# Patient Record
Sex: Male | Born: 1945 | Race: White | Hispanic: No | Smoking: Former smoker
Health system: Southern US, Community
[De-identification: ages and names within clinical notes are randomized; demographics above are authoritative.]

## PROBLEM LIST (undated history)

## (undated) DIAGNOSIS — R7989 Other specified abnormal findings of blood chemistry: Secondary | ICD-10-CM

## (undated) DIAGNOSIS — J189 Pneumonia, unspecified organism: Secondary | ICD-10-CM

## (undated) DIAGNOSIS — C4490 Unspecified malignant neoplasm of skin, unspecified: Secondary | ICD-10-CM

## (undated) DIAGNOSIS — L719 Rosacea, unspecified: Secondary | ICD-10-CM

## (undated) DIAGNOSIS — I2699 Other pulmonary embolism without acute cor pulmonale: Secondary | ICD-10-CM

## (undated) DIAGNOSIS — E785 Hyperlipidemia, unspecified: Secondary | ICD-10-CM

## (undated) DIAGNOSIS — R7303 Prediabetes: Secondary | ICD-10-CM

## (undated) HISTORY — DX: Rosacea, unspecified: L71.9

## (undated) HISTORY — DX: Hyperlipidemia, unspecified: E78.5

## (undated) HISTORY — DX: Other specified abnormal findings of blood chemistry: R79.89

## (undated) HISTORY — PX: TONSILLECTOMY: SUR1361

## (undated) HISTORY — PX: CHOLECYSTECTOMY OPEN: SUR202

---
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
## 2003-07-30 HISTORY — PX: ORIF TIBIA & FIBULA FRACTURES: SHX2131

## 2004-01-27 ENCOUNTER — Ambulatory Visit (HOSPITAL_COMMUNITY): Admission: RE | Admit: 2004-01-27 | Discharge: 2004-01-27 | Payer: Self-pay | Admitting: Gastroenterology

## 2004-01-27 ENCOUNTER — Encounter (INDEPENDENT_AMBULATORY_CARE_PROVIDER_SITE_OTHER): Payer: Self-pay | Admitting: *Deleted

## 2004-03-18 ENCOUNTER — Inpatient Hospital Stay (HOSPITAL_COMMUNITY): Admission: EM | Admit: 2004-03-18 | Discharge: 2004-03-19 | Payer: Self-pay | Admitting: *Deleted

## 2008-09-04 ENCOUNTER — Emergency Department (HOSPITAL_COMMUNITY): Admission: EM | Admit: 2008-09-04 | Discharge: 2008-09-04 | Payer: Self-pay | Admitting: Emergency Medicine

## 2009-01-24 ENCOUNTER — Inpatient Hospital Stay (HOSPITAL_COMMUNITY): Admission: AD | Admit: 2009-01-24 | Discharge: 2009-01-26 | Payer: Self-pay | Admitting: General Surgery

## 2009-01-24 ENCOUNTER — Encounter (INDEPENDENT_AMBULATORY_CARE_PROVIDER_SITE_OTHER): Payer: Self-pay | Admitting: General Surgery

## 2010-01-24 ENCOUNTER — Encounter: Admission: RE | Admit: 2010-01-24 | Discharge: 2010-01-24 | Payer: Self-pay | Admitting: Family Medicine

## 2010-01-24 ENCOUNTER — Ambulatory Visit (HOSPITAL_COMMUNITY): Admission: RE | Admit: 2010-01-24 | Discharge: 2010-01-24 | Payer: Self-pay | Admitting: Family Medicine

## 2010-01-26 ENCOUNTER — Encounter (HOSPITAL_COMMUNITY): Admission: RE | Admit: 2010-01-26 | Discharge: 2010-01-29 | Payer: Self-pay | Admitting: Family Medicine

## 2010-10-14 LAB — PROTIME-INR
INR: 1.32 (ref 0.00–1.49)
Prothrombin Time: 16.3 seconds — ABNORMAL HIGH (ref 11.6–15.2)

## 2010-11-04 LAB — BASIC METABOLIC PANEL
BUN: 9 mg/dL (ref 6–23)
Calcium: 8.5 mg/dL (ref 8.4–10.5)
Chloride: 102 mEq/L (ref 96–112)
GFR calc Af Amer: >60 mL/min (ref >60)
GFR calc non Af Amer: >60 mL/min (ref >60)
Glucose, Bld: 138 mg/dL — ABNORMAL HIGH (ref 70–99)
Potassium: 3.9 mEq/L (ref 3.5–5.1)

## 2010-11-04 LAB — CBC
HCT: 40.7 % (ref 39.0–52.0)
MCHC: 34.6 g/dL (ref 30.0–36.0)

## 2010-11-05 LAB — COMPREHENSIVE METABOLIC PANEL
ALT: 27 U/L (ref 0–53)
Albumin: 4 g/dL (ref 3.5–5.2)
CO2: 25 mEq/L (ref 19–32)
Calcium: 9.2 mg/dL (ref 8.4–10.5)
Creatinine, Ser: 0.92 mg/dL (ref 0.4–1.5)
GFR calc non Af Amer: >60 mL/min (ref >60)
Total Bilirubin: 1.2 mg/dL (ref 0.3–1.2)
Total Protein: 7.5 g/dL (ref 6.0–8.3)

## 2010-11-05 LAB — BASIC METABOLIC PANEL
BUN: 9 mg/dL (ref 6–23)
CO2: 25 mEq/L (ref 19–32)
Chloride: 105 mEq/L (ref 96–112)
Creatinine, Ser: 0.85 mg/dL (ref 0.4–1.5)
GFR calc Af Amer: >60 mL/min (ref >60)
Glucose, Bld: 164 mg/dL — ABNORMAL HIGH (ref 70–99)

## 2010-11-05 LAB — CBC
Hemoglobin: 13.9 g/dL (ref 13.0–17.0)
Hemoglobin: 15 g/dL (ref 13.0–17.0)
MCV: 92.3 fL (ref 78.0–100.0)
Platelets: 180 10*3/uL (ref 150–400)
Platelets: 189 10*3/uL (ref 150–400)
RDW: 12.6 % (ref 11.5–15.5)
RDW: 13 % (ref 11.5–15.5)
WBC: 13.2 10*3/uL — ABNORMAL HIGH (ref 4.0–10.5)

## 2010-11-05 LAB — AMYLASE: Amylase: 32 U/L (ref 27–131)

## 2010-11-05 LAB — LIPASE, BLOOD: Lipase: 19 U/L (ref 11–59)

## 2010-11-13 LAB — COMPREHENSIVE METABOLIC PANEL
ALT: 35 U/L (ref 0–53)
BUN: 12 mg/dL (ref 6–23)
CO2: 23 mEq/L (ref 19–32)
Calcium: 9.1 mg/dL (ref 8.4–10.5)
Chloride: 104 mEq/L (ref 96–112)
GFR calc Af Amer: >60 mL/min (ref >60)
GFR calc non Af Amer: >60 mL/min (ref >60)
Potassium: 3.9 mEq/L (ref 3.5–5.1)
Total Bilirubin: 0.9 mg/dL (ref 0.3–1.2)
Total Protein: 6.7 g/dL (ref 6.0–8.3)

## 2010-11-13 LAB — DIFFERENTIAL
Basophils Absolute: 0.4 10*3/uL — ABNORMAL HIGH (ref 0.0–0.1)
Basophils Relative: 4 % — ABNORMAL HIGH (ref 0–1)
Eosinophils Absolute: 0 10*3/uL (ref 0.0–0.7)
Monocytes Relative: 5 % (ref 3–12)
Neutro Abs: 8.6 10*3/uL — ABNORMAL HIGH (ref 1.7–7.7)

## 2010-11-13 LAB — CBC: HCT: 42.3 % (ref 39.0–52.0)

## 2010-12-11 NOTE — H&P (Signed)
[name], BURAK              ACCOUNT NO.:  [id number]   MEDICAL RECORD NO.:  [id number]         PATIENT TYPE:  LINP   LOCATION:                               FACILITY:  [facility]   PHYSICIAN:  Almond Lint, MD       DATE OF BIRTH:  Dec 02, 1945   DATE OF ADMISSION:  01/24/2009  DATE OF DISCHARGE:                              HISTORY & PHYSICAL   CHIEF COMPLAINT:  Abdominal pain.   HISTORY OF PRESENT ILLNESS:  Mr. Alpert is a 65 year old male who was  diagnosed with gall stones at the beginning of this year.  He saw Dr.  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  He comes in today to the urgent  office with 2 days of unrelenting abdominal pain.  He tried taking  oxycodone, which helped some, but in the last day-and-a-half he has been  unable to take pills because of the extreme nausea and pain.  He has not  eaten any food for 2 days either.  He is in tears on the examining room  table because of the pain.  He describes this as a dull ache in the  epigastrium and right upper quadrant.  He denies fevers or chills.   PAST MEDICAL HISTORY:  Significant for hypercholesterolemia and rosacea.   PAST SURGICAL HISTORY:  Appendectomy.   FAMILY HISTORY:  Father had an MI.   REVIEW OF SYSTEMS:  Otherwise, negative x11 systems.   PHYSICAL EXAM:  VITAL SIGNS:  Temperature 98.4, pulse 52, blood pressure  176/82, height 6 feet 2 inches, weight 215 pounds.  GENERAL:  He is alert and oriented x3 and looks uncomfortable.  He is  lying on the table and crying in pain.  PSYCH:  Mood and affect are normal.  CARDIOVASCULAR:  Regular.  ABDOMEN:  Soft, nondistended.  Tender in the epigastric area.  EXTREMITIES:  Warm.   DIAGNOSTIC STUDIES:  Ultrasound from [facility] noted by Dr.  Lindie Spruce to be positive for stones and mild gallbladder wall thickening.  This was performed on September 27, 2008.  He had fatty infiltration of the  liver.   ASSESSMENT AND PLAN:  We will admit him to the hospital for IV pain  medication, IV antibiotics, and labs.  He will have a cholecystectomy as  appropriate.  I will leave him n.p.o. for possible surgical intervention  tonight or tomorrow.  This was discussed with Dr. Derrell Lolling to admit.     Almond Lint, MD  Electronically Signed    FB/MEDQ  D:  01/24/2009  T:  01/24/2009  Job:  [id number]

## 2010-12-11 NOTE — Op Note (Signed)
[name], [name]              ACCOUNT NO.:  [id number]   MEDICAL RECORD NO.:  [id number]          PATIENT TYPE:  INP   LOCATION:  1606                         FACILITY:  [facility]   PHYSICIAN:  Sharlet Salina T. Hoxworth, M.D.DATE OF BIRTH:  08-25-1945   DATE OF PROCEDURE:  01/25/2009  DATE OF DISCHARGE:                               OPERATIVE REPORT   PREOPERATIVE DIAGNOSES:  1. Cholelithiasis and cholecystitis.  2. Umbilical hernia.   POSTOPERATIVE DIAGNOSES:  1. Cholelithiasis and cholecystitis.  2. Umbilical hernia.   SURGICAL PROCEDURES:  1. Laparoscopic cholecystectomy with intraoperative cholangiogram.  2. Repair of umbilical hernia.   SURGEON:  Lorne Skeens. Hoxworth, M.D.   ASSISTANT:  Dr. Cicero Duck   ANESTHESIA:  General.   BRIEF HISTORY:  [name] is a 64 year old male with a previous history  of episodic abdominal pain and known gallstones.  He had been  tentatively scheduled for cholecystectomy but presents with several days  of persistent epigastric abdominal pain and was found to be tender in  the office and was admitted yesterday.  Repeated ultrasound shows  thickening of the gallbladder wall, gallstones and normal common bile  duct.  LFTs are normal.  We have recommended proceeding with urgent  laparoscopic cholecystectomy with cholangiogram.  He also has a  symptomatic umbilical hernia that will be repaired simultaneously.  The  nature of the procedures, indications, risks of bleeding, infection,  bile leak, bile duct injury, anesthetic complications were discussed and  understood.  He is now brought to the operating room for these  procedures.   DESCRIPTION OF OPERATION:  The patient was brought to the operating  room, placed in supine position on the operating table and general  endotracheal anesthesia was induced.  He received preoperative IV  antibiotics.  The abdomen was widely sterilely prepped and draped.  PAS  were placed.  Correct patient and  procedure were verified.   I made an infraumbilical curvilinear incision just beneath the hernia.  Dissection was carried down to subcutaneous tissue.  The umbilical skin  was dissected up off of the hernia sac and the hernia contents were  dissected way from surrounding subcu down to the level the fascia.  The  hernia sac was opened and some preperitoneal fat reduced and using this  opening with two stay sutures of 0 Vicryl, the Hasson trocar was placed  and pneumoperitoneum established.  Under direct vision, an 11-mm trocar  was placed in the subxiphoid area and two 5-mm trocars along the right  subcostal margin.  The colon was somewhat dilated, but not terribly in  the way.  The gallbladder was exposed and some omental inflammatory  adhesions were taken down.  The gallbladder was tense, thickened and  acutely and subacutely inflamed.  The gallbladder was aspirated with  aspiration needle with fairly good decompression and the fundus was  grasped and elevated up over the liver.  Further fibrofatty adhesions  were taken down off the infundibulum, which was then retracted  inferolaterally.  The distal gallbladder was dissected and there was a  lot of induration and edema, but the dissection proceeded  satisfactorily  and we were able to fully dissect the distal gallbladder out of Calot's  triangle and the cystic duct was identified and the cystic artery  clearly identified in Calot's triangle and the cystic duct gallbladder  junction dissected 360 degrees and the cystic duct dissected out over  about a centimeter.   The cystic duct also appeared somewhat inflamed and not completely  healthy.  The cystic duct was clipped at the gallbladder junction and  then the operative cholangiogram obtained via the cystic duct.  There  was some extravasation of contrast at the cystic duct but also good  filling of a normal common bile duct and intrahepatic ducts and there  was free flow into the  duodenum without filling defects or obstruction.  Following this, the cholangiocath was removed.  The cystic duct was  triply clipped proximally and divided.  The cystic artery was clearly  identified in Calot's triangle, was doubly clipped proximally, clipped  distally and divided.  The gallbladder was then dissected free from its  bed using hook cautery.  Again noted were some marked inflammatory  changes and patchy gangrenous changes.  The gallbladder was detached,  placed into an EndoCatch bag.   Hemostasis was assured in the gallbladder bed.  The right upper quadrant  was irrigated until clear.  A closed suction drain was left in the  subhepatic space and brought out through one of the lateral 5-mm trocar  sites.  Gallbladder was then withdrawn through the umbilical defect.   The fascia at the umbilical defect was then clearly defined, dissected  free slightly and herniated preperitoneal fat excised with cautery.  The  hernia was then closed transversely with interrupted 0 Prolene sutures.  We could visualize the repair intra-abdominally with  the laparoscopic.  There was no tissue intra-abdominally caught up into the repair.   Following this, camera was removed, CO2 evacuated and trocars removed.  Skin incisions were closed with subcuticular Monocryl and Steri-Strips.  Sponge and instrument counts were correct.  The patient was taken to  recovery in good condition.      Lorne Skeens. Hoxworth, M.D.  Electronically Signed     BTH/MEDQ  D:  01/25/2009  T:  01/25/2009  Job:  [id number]

## 2010-12-14 NOTE — Op Note (Signed)
NAME:  [name], [name]                        ACCOUNT NO.:  [id number]   MEDICAL RECORD NO.:  [id number]                   PATIENT TYPE:  AMB   LOCATION:  ENDO                                 FACILITY:  [facility]   PHYSICIAN:  Petra Kuba, M.D.                 DATE OF BIRTH:  01-Feb-1946   DATE OF PROCEDURE:  01/27/2004  DATE OF DISCHARGE:                                 OPERATIVE REPORT   PROCEDURE:  Colonoscopy.   INDICATION:  Screening.  Consent was signed after risks, benefits, methods,  options thoroughly discussed in the office.   MEDICINES USED:  1. Demerol 70.  2. Versed 7.   DESCRIPTION OF PROCEDURE:  Rectal inspection was pertinent for external  hemorrhoids, small.  Digital exam was negative.  The video pediatric  adjustable colonoscope was inserted and with rolling him on his back and  abdominal pressure, fairly easily advanced around the colon to the cecum.  No obvious abnormality was seen on insertion.  The cecum was identified by  the appendiceal orifice and the ileocecal valve.  The prep was adequate.  There was some liquid stool that required washing and suctioning.  Scope was  slowly withdrawn.  In the cecal pole, 2 questionable tiny polyps were seen  and were carefully hot biopsied x 1 each.  Scope was further withdrawn.  There were 2 other tiny ascending and hepatic flexure tiny polyps which as  well were hot biopsied x 1 and put in the same container and possibly 1  other tiny proximal transverse which again was hot biopsied and put in the  same container.  The scope was further withdrawn.  The rest of the  transverse was normal.  As the scope was withdrawn around the splenic  flexure, another tiny polyp was seen and was hot biopsied x 1 and put in a  second container.  There was a small proximal sigmoid polyp then seen which  was hot biopsied x 3 and put in the same container with the tiny one from  the descending.  Scope was slowly withdrawn.  Two tiny distal  sigmoid polyps  were seen, hot biopsied, and put in a third container.  No other  abnormalities were seen as we slowly withdrew back to the rectum.  Anorectal  pull-through and retroflexion confirmed some small hemorrhoids.  The scope  was reinserted a short ways up the left side of the colon; air and water  were suctioned and scope removed.  The patient tolerated the procedure  adequately.  There was no obvious immediate complication.   ENDOSCOPIC DIAGNOSES:  1. Internal/external hemorrhoids.  2. Multiple tiny polyps, hot biopsied, sigmoid, descending, and right side.  3. One small polyp in the proximal sigmoid, hot biopsied x 3.  4. Otherwise, within normal limits to the cecum.   PLAN:  1. Await pathology to determine future colonic screening.  2. Otherwise, return care to  Dr. Julian Reil for the customary health care     maintenance to include yearly rectals and guaiacs.                                               Petra Kuba, M.D.    MEM/MEDQ  D:  01/27/2004  T:  01/27/2004  Job:  [id number]   cc:   Schuyler Amor, M.D.  [address]  [place], [place] [address]  Fax: [phone]

## 2010-12-14 NOTE — H&P (Signed)
NAME:  [name], GOSWAMI                        ACCOUNT NO.:  [id number]   MEDICAL RECORD NO.:  [id number]                   PATIENT TYPE:  INP   LOCATION:  0102                                 FACILITY:  [facility]   PHYSICIAN:  Feliberto Gottron. Turner Daniels, M.D.                DATE OF BIRTH:  05/12/1946   DATE OF ADMISSION:  03/18/2004  DATE OF DISCHARGE:                                HISTORY & PHYSICAL   CHIEF COMPLAINT:  Right tibiofibular fracture, distal tibial, proximal  fibula, spiral, low energy.   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 __________ at [facility] ER, told  him that he wanted to have the tibia fixed here in [place].  I was  contacted and transfer arranged.  He arrived in [place] around 2:00 in  the afternoon on 18 March 2004, and the date of injury was 17 March 2004.  No other injuries.  The fracture was closed, displaced about 80%, but  essentially no angular deformity.  No previous orthopedic problems.   PAST MEDICAL HISTORY:  1. Appendectomy at age 27.  No problems with anesthesia.  2. Had a colonoscopy 2 months ago that was negative.   FAMILY HISTORY:  He is married, lives with his three children, is a Production assistant, radio at [employer], and has asked to resume working this  week.   ALLERGIES:  No known allergies.   MEDICATIONS:  1. Lipitor 10 mg daily.  2. Minocycline 100 mg p.o. daily p.r.n.   REVIEW OF SYSTEMS:  No chest pain, no shortness of breath, no peptic ulcer  disease, no loss of consciousness or seizures.  No significant medical  problems.   PHYSICAL EXAMINATION:   VITAL SIGNS:  Temperature 99.3, pulse 63, respiratory  rate 18, blood pressure 132/68.  GENERAL:  Well-nourished, well-developed, 65 year old man about 200 pounds  and 6 feet tall.  HEENT:  He wears glasses.  His teeth are intact.  Nose and throat are clear.  TMs are clear.  Pupils are PERRLA, full extraocular range of motion.  NECK:  Supple, full range of motion of the neck.  CHEST:  Clear.  HEART:  Regular rate and rhythm.  ABDOMEN:  Soft and nontender.  EXTREMITIES:  Right lower extremity is in a splint.  That was not removed  since the orthopedist in [place] reassured me that this was a closed  injury, and the patient also stated that there was no blood or abrasions on  his leg.  He moves his toes up and down  without difficulty.  He has normal  dorsalis pedis pulse.  Plain radiographs are reviewed and are as dictated.  No other lab data is available.  NEUROLOGIC:  He is alert and oriented x 3.   ASSESSMENT:  Closed low energy spiral tibiofibular fracture, distal tibia,  proximal fibula in a 65 year old man, otherwise healthy.   PLAN:  He will be taken for percutaneous open reduction, internal fixation  using a DePuy titanium IM nail with proximal and distal interlock.  Risks  and benefits of surgery discussed with the patient.  __________ surgical  intervention, will be kept for 23 hour observation at [facility].                                               Feliberto Gottron. Turner Daniels, M.D.    Ovid Curd  D:  03/18/2004  T:  03/18/2004  Job:  [id number]   cc:   Delories Heinz. [place]

## 2010-12-14 NOTE — Op Note (Signed)
NAME:  [name], [name]                        ACCOUNT NO.:  [id number]   MEDICAL RECORD NO.:  [id number]                   PATIENT TYPE:  INP   LOCATION:  0102                                 FACILITY:  [facility]   PHYSICIAN:  Feliberto Gottron. Turner Daniels, M.D.                DATE OF BIRTH:  1945-09-05   DATE OF PROCEDURE:  03/18/2004  DATE OF DISCHARGE:                                 OPERATIVE REPORT   PREOPERATIVE DIAGNOSIS:  Right tib/fib fracture, distal spiral on the tibia,  proximal spiral on the fibula.   POSTOPERATIVE DIAGNOSIS:  Right tib/fib fracture, distal spiral on the  tibia, proximal spiral on the fibula.   PROCEDURE:  Open reduction and internal fixation using a 9 mm x 37.5 cm Ace  Depuy nail two proximal cross locking screws, three distal locking screws,  one anterior posterior, two medial to lateral.   SURGEON:  Feliberto Gottron. Turner Daniels, M.D.   ASSISTANT:  __________ P.A.C.   ANESTHETIC:  General endotracheal.   ESTIMATED BLOOD LOSS:  100 cc.   FLUIDS REPLACED:  One liter of crystalloid.   DRAINS PLACED:  None.   TOURNIQUET TIME:  None.   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  He sent his sons off to find help and he stayed in the woods by  himself for about 12 hours.  Help came and carried him out of the woods and  he was taken to [facility] and arrived there at around 6:00 on the  morning of 21 August, 2005.  He was evaluated by Dr. __________, an  orthopedist in [place] who arranged transfer to [facility] for  open reduction and internal fixation.  The risks and benefits of surgery  were well understood by the patient.   DESCRIPTION OF PROCEDURE:  The patient was identified by the armband and  taken to the operating room at [facility].   Appropriate  anesthetic monitor was attached.  General endotracheal anesthesia was  induced with the patient in the supine position.  A tourniquet was applied  high to the right thigh, but never used and the right lower extremity was  prepped and draped in the usual sterile fashion from the toes to the  tourniquet.  Using the large triangle, well padded, the knee was hyperflexed  and a medial parapatellar incision was made in the skin starting out at the  level of the tibial plateau and going proximal towards the inferior pole of  the patella, about 4 cm in length to the skin and subcutaneous tissue.  An  incision was made in the medial retinaculum along the patellar tendon,  allowing [place] to place an awl into the center of the proximal  tibia about 1 cm  behind the shoulder of the plateau.  This was followed by the small guide  wire, which was over reamed with the 13 mm reamer followed by the ball-  tipped guide wire that was slightly bent and this was then taken down  through the tibial shaft through the fracture site into the center of the  tibial metaphyseal flare to the level of the epiphyseal scar.  We then  sequentially reamed up to 10.5 mm flexible reamer using a tissue protector  proximally and measured for a 9 mm x 37.5 cm tibial nail.  This was  assembled on a driver and driven to the appropriate depth.  The tip of the  nail was near the epiphyseal scar about 1 mm or 2 mm above it and proximally  it was about 1 cm below the shoulder of the tibial plateau.  With the  driving platform in place, the crossed locking screws were placed  proximally.  One, I believe, was 45 mm in length and one was 65 mm in length  from the DePuy tibial nail set.  These were placed through small stab wounds  using the tibial guide.  The C arm control was used to confirm good position  of the screws.  Distally, using a free-hand technique, two medial to lateral  screws and one anterior-posterior screw was  used to fix the rod distally,  obtaining good firm fixation and the C arm image has confirmed basically an  anatomic reduction of the spiral fracture.  All wounds were thoroughly  washed out with normal saline solution.  The stab wounds were closed with  single 4-0 nylon suture loops.  The medial parapatellar ligament wound was  closed with running 2-0 Vicryl suture and then a running 4-0 nylon suture.  Dressing of Xeroform 4 x 4 dressing sponges and an ACE wrap was applied.  No  splint was used because of the firm fixation.  The patient was awakened and  taken to the recovery room without difficulty for observation x 23 hours or  discharge home today.                                               Feliberto Gottron. Turner Daniels, M.D.    Ovid Curd  D:  03/18/2004  T:  03/18/2004  Job:  [id number]

## 2011-03-29 ENCOUNTER — Ambulatory Visit
Admission: RE | Admit: 2011-03-29 | Discharge: 2011-03-29 | Disposition: A | Discharge status: Home / Self Care | Payer: BC Managed Care – PPO | Source: Ambulatory Visit | Attending: Family Medicine | Admitting: Family Medicine

## 2011-03-29 ENCOUNTER — Other Ambulatory Visit: Payer: Self-pay | Admitting: Family Medicine

## 2011-03-29 DIAGNOSIS — M79669 Pain in unspecified lower leg: Secondary | ICD-10-CM

## 2012-03-17 ENCOUNTER — Ambulatory Visit (INDEPENDENT_AMBULATORY_CARE_PROVIDER_SITE_OTHER): Payer: BC Managed Care – PPO | Admitting: Family Medicine

## 2012-03-17 VITALS — BP 110/60 | HR 54 | Temp 98.7°F | Resp 16 | Ht 73.0 in | Wt 198.0 lb

## 2012-03-17 DIAGNOSIS — B356 Tinea cruris: Secondary | ICD-10-CM

## 2012-03-17 MED ORDER — CLOTRIMAZOLE 1 % EX CREA: TOPICAL_CREAM | Freq: Two times a day (BID) | CUTANEOUS | Status: AC

## 2012-03-17 NOTE — Progress Notes (Signed)
  Subjective:    Patient ID: [name], male    DOB: Sep 17, 1945, 66 y.o.   MRN: [id number]  HPI [name] is a 66 y.o. male  rash in groin - past 5 days.  Itching. New sexual contact 5 days ago.  Worried that new partner may have given this to him.  Tx: none. 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 On coumadin 7.5mg  qd. Last INR 2.6 to 2.8.  Takes minocyline for rosacea, Prediabetes.  [facility] - primary provider.    Review of Systems  Genitourinary: Negative for frequency, discharge, genital sores, penile pain and testicular pain.       Objective:   Physical Exam  Constitutional: He is oriented to person, place, and time. He appears well-developed and well-nourished.  Pulmonary/Chest: Effort normal.  Neurological: He is alert and oriented to person, place, and time.  Skin: Skin is warm. Rash noted.          Elliptical slight erythematous rash. Inguinal area bilaterally.  Slight elevated borders.  No penile discharge, or scrotal erythema.   Psychiatric: He has a normal mood and affect. His behavior is normal.       Assessment & Plan:  [name] is a 66 y.o. male 1. Tinea cruris  clotrimazole (LOTRIMIN) 1 % cream  apply to affected areas bilaterally for approx 2 weeks. H/o given from utd.  Discussed std testing with new partner and unprotected sex.  Declined at this point. rtc recautions/.

## 2012-03-17 NOTE — Patient Instructions (Addendum)
Jock Itch Jock itch is a fungal infection of the skin in the groin area. It is sometimes called "ringworm" even though it is not caused by a worm. A fungus is a type of germ that thrives in dark, damp places.  CAUSES  This infection may spread from:  A fungus infection elsewhere on the body (such as athlete's foot).   Sharing towels or clothing.  This infection is more common in:  Hot, humid climates.   People who wear tight-fitting clothing or wet bathing suits for long periods of time.   Athletes.   Overweight people.   People with diabetes.  SYMPTOMS  Jock itch causes the following symptoms:  Red, pink or brown rash in the groin. Rash may spread to the thighs, anus, and buttocks.   Itching.  DIAGNOSIS  Your caregiver may make the diagnosis by looking at the rash. Sometimes a skin scraping will be sent to test for fungus. Testing can be done either by looking under the microscope or by doing a culture (test to try to grow the fungus). A culture can take up to 2 weeks to come back. TREATMENT  Jock itch may be treated with:  Skin cream or ointment to kill fungus.   Medicine by mouth to kill fungus.   Skin cream or ointment to calm the itching.   Compresses or medicated powders to dry the infected skin.  HOME CARE INSTRUCTIONS   Be sure to treat the rash completely. Follow your caregiver's instructions. It can take a couple of weeks to treat. If you do not treat the infection long enough, the rash can come back.   Wear loose-fitting clothing.   Men should wear cotton boxer shorts.   Women should wear cotton underwear.   Avoid hot baths.   Dry the groin area well after bathing.  SEEK MEDICAL CARE IF:   Your rash is worse.   Your rash is spreading.   Your rash returns after treatment is finished.   Your rash is not gone in 4 weeks. Fungal infections are slow to respond to treatment. Some redness may remain for several weeks after the fungus is gone.  SEEK  IMMEDIATE MEDICAL CARE IF:  The area becomes red, warm, tender, and swollen.   You have a fever.  Document Released: 07/05/2002 Document Revised: 07/04/2011 Document Reviewed: 06/03/2008 [facility] Patient Information 2012 [place], [place].  Return to the clinic or go to the nearest emergency room if any of your symptoms worsen or new symptoms occur.

## 2012-04-04 ENCOUNTER — Ambulatory Visit (INDEPENDENT_AMBULATORY_CARE_PROVIDER_SITE_OTHER): Payer: BC Managed Care – PPO | Admitting: Emergency Medicine

## 2012-04-04 VITALS — BP 129/63 | HR 66 | Temp 98.1°F | Resp 16 | Ht 73.0 in | Wt 195.0 lb

## 2012-04-04 DIAGNOSIS — N529 Male erectile dysfunction, unspecified: Secondary | ICD-10-CM

## 2012-04-04 DIAGNOSIS — E1169 Type 2 diabetes mellitus with other specified complication: Secondary | ICD-10-CM

## 2012-04-04 MED ORDER — VARDENAFIL HCL 20 MG PO TABS: 20.0000 mg | ORAL_TABLET | Freq: Every day | ORAL | Status: DC | PRN

## 2012-04-04 NOTE — Addendum Note (Signed)
Addended by: Carmelina Dane on: 04/04/2012 03:45 PM   Modules accepted: Orders

## 2012-04-04 NOTE — Progress Notes (Signed)
   Date:  04/04/2012   Name:  [name]   DOB:  May 29, 1946   MRN:  [id number] Gender: male Age: 66 y.o.  PCP:  No primary provider on file.    Chief Complaint: ED, testerone testing   History of Present Illness:  [name] is a 67 y.o. pleasant patient who presents with the following:  Has erectile dysfunction.  Not having good luck with cialis or viagra and has requested testosterone level and a prescription for levitra.    There is no problem list on file for this patient.   No past medical history on file.  No past surgical history on file.  History  Substance Use Topics  . Smoking status: Former Smoker -- 20 years    Types: Cigarettes    Quit date: 03/17/1992  . Smokeless tobacco: Not on file  . Alcohol Use: Not on file    No family history on file.  No Known Allergies  Medication list has been reviewed and updated.  Current Outpatient Prescriptions on File Prior to Visit  Medication Sig Dispense Refill  . metFORMIN (GLUCOPHAGE) 500 MG tablet Take 500 mg by mouth 2 (two) times daily with a meal.      . minocycline (MINOCIN,DYNACIN) 100 MG capsule Take 100 mg by mouth daily as needed.      . simvastatin (ZOCOR) 20 MG tablet Take 20 mg by mouth every evening.      . warfarin (COUMADIN) 7.5 MG tablet Take 7.5 mg by mouth daily.      . clotrimazole (LOTRIMIN) 1 % cream Apply topically 2 (two) times daily.  30 g  1    Review of Systems:  As per HPI, otherwise negative.    Physical Examination: Filed Vitals:   04/04/12 1523  BP: 129/63  Pulse: 66  Temp: 98.1 F (36.7 C)  Resp: 16   Filed Vitals:   04/04/12 1523  Height: 6\' 1"  (1.854 m)  Weight: 195 lb (88.451 kg)   Body mass index is 25.73 kg/(m^2). Ideal Body Weight: Weight in (lb) to have BMI = 25: 189.1    GEN: WDWN, NAD, Non-toxic, Alert & Oriented x 3 HEENT: Atraumatic, Normocephalic.  Ears and Nose: No external deformity. EXTR: No clubbing/cyanosis/edema NEURO: Normal gait.    PSYCH: Normally interactive. Conversant. Not depressed or anxious appearing.  Calm demeanor.    Assessment and Plan: Erectile dysfunction Testosterone Follow up as needed  Carmelina Dane, MD

## 2012-04-05 ENCOUNTER — Other Ambulatory Visit: Payer: Self-pay | Admitting: Emergency Medicine

## 2012-04-05 LAB — TESTOSTERONE: Testosterone: 228.42 ng/dL — ABNORMAL LOW (ref 300–890)

## 2012-04-05 MED ORDER — TESTOSTERONE 10 MG/ACT (2%) TD GEL: 40.0000 mg | Freq: Every day | TRANSDERMAL | Status: DC

## 2012-07-05 ENCOUNTER — Other Ambulatory Visit: Payer: Self-pay | Admitting: Radiology

## 2012-07-05 ENCOUNTER — Other Ambulatory Visit: Payer: Self-pay | Admitting: Emergency Medicine

## 2012-07-05 NOTE — Telephone Encounter (Signed)
That is fine 

## 2012-07-05 NOTE — Telephone Encounter (Signed)
[place] [facility] faxed request for [name] 10mg  gel please advise on refill.

## 2012-07-07 MED ORDER — TESTOSTERONE 10 MG/ACT (2%) TD GEL: 40.0000 mg | Freq: Every day | TRANSDERMAL | Status: DC

## 2012-07-07 NOTE — Telephone Encounter (Signed)
Called this in for him.

## 2012-07-13 ENCOUNTER — Other Ambulatory Visit: Payer: Self-pay | Admitting: Radiology

## 2012-11-16 ENCOUNTER — Ambulatory Visit (HOSPITAL_COMMUNITY)
Admission: RE | Admit: 2012-11-16 | Discharge: 2012-11-16 | Disposition: A | Discharge status: Home / Self Care | Payer: BC Managed Care – PPO | Source: Ambulatory Visit | Attending: Family Medicine | Admitting: Family Medicine

## 2012-11-16 DIAGNOSIS — R229 Localized swelling, mass and lump, unspecified: Secondary | ICD-10-CM | POA: Insufficient documentation

## 2012-11-16 DIAGNOSIS — Z86718 Personal history of other venous thrombosis and embolism: Secondary | ICD-10-CM

## 2012-11-16 DIAGNOSIS — M25562 Pain in left knee: Secondary | ICD-10-CM

## 2012-11-16 NOTE — Progress Notes (Signed)
*  PRELIMINARY RESULTS* Vascular Ultrasound Left lower extremity venous duplex has been completed.  Preliminary findings:  Left = Negative for DVT. Superficial thrombosis of a varicose vein is noted at proximal calf area.  Called report to Dr. Nelda Severe.  Farrel Demark, RDMS, RVT  11/16/2012, 6:33 PM

## 2012-11-18 ENCOUNTER — Other Ambulatory Visit: Payer: Self-pay | Admitting: Gastroenterology

## 2013-10-25 ENCOUNTER — Ambulatory Visit (INDEPENDENT_AMBULATORY_CARE_PROVIDER_SITE_OTHER): Payer: Medicare Other | Admitting: Emergency Medicine

## 2013-10-25 VITALS — BP 122/64 | HR 54 | Temp 97.5°F | Resp 16 | Ht 73.0 in | Wt 190.0 lb

## 2013-10-25 DIAGNOSIS — R21 Rash and other nonspecific skin eruption: Secondary | ICD-10-CM

## 2013-10-25 DIAGNOSIS — Z202 Contact with and (suspected) exposure to infections with a predominantly sexual mode of transmission: Secondary | ICD-10-CM

## 2013-10-25 DIAGNOSIS — A6000 Herpesviral infection of urogenital system, unspecified: Secondary | ICD-10-CM

## 2013-10-25 MED ORDER — VALACYCLOVIR HCL 1 G PO TABS: 1000.0000 mg | ORAL_TABLET | Freq: Three times a day (TID) | ORAL | Status: DC

## 2013-10-25 NOTE — Progress Notes (Signed)
[facility] [address] [place] [address] [phone]  Date:  10/25/2013   Name:  [name]   DOB:  1945/08/06   MRN:  [id number]  PCP:  [name], MD    Chief Complaint: Lesion   History of Present Illness:  [name] is a 68 y.o. very pleasant male patient who presents with the following:  Had unprotected sex last week and by the weekend had a crop of vesicular lesions on the suprapubic area.  No discharge or dysuria.  No improvement with over the counter medications or other home remedies. Denies other complaint or health concern today.   There are no active problems to display for this patient.   Past Medical History  Diagnosis Date  . Diabetes mellitus without complication   . Hyperlipidemia   . Rosacea   . Low testosterone     Past Surgical History  Procedure Laterality Date  . Appendectomy      History  Substance Use Topics  . Smoking status: Former Smoker -- 20 years    Types: Cigarettes    Quit date: 03/17/1992  . Smokeless tobacco: Not on file  . Alcohol Use: Not on file    No family history on file.  No Known Allergies  Medication list has been reviewed and updated.  Current Outpatient Prescriptions on File Prior to Visit  Medication Sig Dispense Refill  . simvastatin (ZOCOR) 20 MG tablet Take 20 mg by mouth every evening.      . Testosterone 10 MG/ACT (2%) GEL Place 40 mg onto the skin daily. Apply 2 pumps to each thigh daily.  60 g  2  . vardenafil (LEVITRA) 20 MG tablet Take 1 tablet (20 mg total) by mouth daily as needed for erectile dysfunction.  10 tablet  12  . metFORMIN (GLUCOPHAGE) 500 MG tablet Take 500 mg by mouth 2 (two) times daily with a meal.      . warfarin (COUMADIN) 7.5 MG tablet Take 7.5 mg by mouth daily.       No current facility-administered medications on file prior to visit.    Review of Systems:  As per HPI, otherwise negative.    Physical Examination: Filed  Vitals:   10/25/13 1330  BP: 122/64  Pulse: 54  Temp: 97.5 F (36.4 C)  Resp: 16   Filed Vitals:   10/25/13 1330  Height: 6\' 1"  (1.854 m)  Weight: 190 lb (86.183 kg)   Body mass index is 25.07 kg/(m^2). Ideal Body Weight: Weight in (lb) to have BMI = 25: 189.1   GEN: WDWN, NAD, Non-toxic, Alert & Oriented x 3 HEENT: Atraumatic, Normocephalic.  Ears and Nose: No external deformity. EXTR: No clubbing/cyanosis/edema NEURO: Normal gait.  PSYCH: Normally interactive. Conversant. Not depressed or anxious appearing.  Calm demeanor.  Genitalia:  Normal male circumcised SKIN:  Small eruption of ulcerated lesion consistent with herpes on suprapubic area.  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  Labs   Signed,  Ellison Carwin, MD

## 2013-10-25 NOTE — Patient Instructions (Signed)
Genital Herpes  Genital herpes is a sexually transmitted disease. This means that it is a disease passed by having sex with an infected person. There is no cure for genital herpes. The time between attacks can be months to years. The virus may live in a person but produce no problems (symptoms). This infection can be passed to a baby as it travels down the birth canal (vagina). In a newborn, this can cause central nervous system damage, eye damage, or even death. The virus that causes genital herpes is usually HSV-2 virus. The virus that causes oral herpes is usually HSV-1. The diagnosis (learning what is wrong) is made through culture results.  SYMPTOMS   Usually symptoms of pain and itching begin a few days to a week after contact. It first appears as small blisters that progress to small painful ulcers which then scab over and heal after several days. It affects the outer genitalia, birth canal, cervix, penis, anal area, buttocks, and thighs.  HOME CARE INSTRUCTIONS   · Keep ulcerated areas dry and clean.  · Take medications as directed. Antiviral medications can speed up healing. They will not prevent recurrences or cure this infection. These medications can also be taken for suppression if there are frequent recurrences.  · While the infection is active, it is contagious. Avoid all sexual contact during active infections.  · Condoms may help prevent spread of the herpes virus.  · Practice safe sex.  · Wash your hands thoroughly after touching the genital area.  · Avoid touching your eyes after touching your genital area.  · Inform your caregiver if you have had genital herpes and become pregnant. It is your responsibility to insure a safe outcome for your baby in this pregnancy.  · Only take over-the-counter or prescription medicines for pain, discomfort, or fever as directed by your caregiver.  SEEK MEDICAL CARE IF:   · You have a recurrence of this infection.  · You do not respond to medications and are not  improving.  · You have new sources of pain or discharge which have changed from the original infection.  · You have an oral temperature above 102° F (38.9° C).  · You develop abdominal pain.  · You develop eye pain or signs of eye infection.  Document Released: 07/12/2000 Document Revised: 10/07/2011 Document Reviewed: 08/02/2009  Exit[employer]® Patient Information ©2014 [employer].

## 2013-10-26 LAB — T.PALLIDUM AB, TOTAL: T pallidum Antibodies (TP-PA): 0.13 S/CO (ref <0.90)

## 2013-10-26 LAB — RPR: RPR: REACTIVE — AB

## 2013-10-26 LAB — GC/CHLAMYDIA PROBE AMP
CT PROBE, AMP APTIMA: NEGATIVE
GC PROBE AMP APTIMA: NEGATIVE

## 2013-10-26 LAB — RPR TITER

## 2013-10-27 LAB — HERPES SIMPLEX VIRUS CULTURE: ORGANISM ID, BACTERIA: DETECTED

## 2013-10-28 MED ORDER — VALACYCLOVIR HCL 1 G PO TABS: 1000.0000 mg | ORAL_TABLET | Freq: Every day | ORAL | Status: DC

## 2013-10-28 NOTE — Addendum Note (Signed)
Addended by: [name] on: 10/28/2013 09:16 AM   Modules accepted: Orders

## 2013-11-03 ENCOUNTER — Telehealth: Payer: Self-pay

## 2013-11-03 ENCOUNTER — Ambulatory Visit (INDEPENDENT_AMBULATORY_CARE_PROVIDER_SITE_OTHER): Payer: Medicare Other | Admitting: Emergency Medicine

## 2013-11-03 VITALS — BP 120/70 | HR 65 | Temp 98.0°F | Resp 17 | Ht 72.5 in | Wt 192.0 lb

## 2013-11-03 DIAGNOSIS — A6000 Herpesviral infection of urogenital system, unspecified: Secondary | ICD-10-CM

## 2013-11-03 NOTE — Patient Instructions (Signed)
Genital Herpes  Genital herpes is a sexually transmitted disease. This means that it is a disease passed by having sex with an infected person. There is no cure for genital herpes. The time between attacks can be months to years. The virus may live in a person but produce no problems (symptoms). This infection can be passed to a baby as it travels down the birth canal (vagina). In a newborn, this can cause central nervous system damage, eye damage, or even death. The virus that causes genital herpes is usually HSV-2 virus. The virus that causes oral herpes is usually HSV-1. The diagnosis (learning what is wrong) is made through culture results.  SYMPTOMS   Usually symptoms of pain and itching begin a few days to a week after contact. It first appears as small blisters that progress to small painful ulcers which then scab over and heal after several days. It affects the outer genitalia, birth canal, cervix, penis, anal area, buttocks, and thighs.  HOME CARE INSTRUCTIONS   · Keep ulcerated areas dry and clean.  · Take medications as directed. Antiviral medications can speed up healing. They will not prevent recurrences or cure this infection. These medications can also be taken for suppression if there are frequent recurrences.  · While the infection is active, it is contagious. Avoid all sexual contact during active infections.  · Condoms may help prevent spread of the herpes virus.  · Practice safe sex.  · Wash your hands thoroughly after touching the genital area.  · Avoid touching your eyes after touching your genital area.  · Inform your caregiver if you have had genital herpes and become pregnant. It is your responsibility to insure a safe outcome for your baby in this pregnancy.  · Only take over-the-counter or prescription medicines for pain, discomfort, or fever as directed by your caregiver.  SEEK MEDICAL CARE IF:   · You have a recurrence of this infection.  · You do not respond to medications and are not  improving.  · You have new sources of pain or discharge which have changed from the original infection.  · You have an oral temperature above 102° F (38.9° C).  · You develop abdominal pain.  · You develop eye pain or signs of eye infection.  Document Released: 07/12/2000 Document Revised: 10/07/2011 Document Reviewed: 08/02/2009  Exit[employer]® Patient Information ©2014 [employer].

## 2013-11-03 NOTE — Telephone Encounter (Signed)
Pt came by the office on Sunday and he was notified of his labs, but he had a lot of questions about HSV II that I didn't know the answers too. Can someone give him a call when they get a chance and answer his questions please. Thanks so much.

## 2013-11-03 NOTE — Telephone Encounter (Signed)
Disregard message. Pt came in to be seen today

## 2013-11-03 NOTE — Progress Notes (Signed)
[facility] [address] [place] [address] [phone]  Date:  11/03/2013   Name:  [name]   DOB:  Sep 15, 1945   MRN:  [id number]  PCP:  [name], MD    Chief Complaint: Herpes Zoster   History of Present Illness:  [name] is a 68 y.o. very pleasant male patient who presents with the following:  Treated for rash and found to have herpes simplex (genital)  Has nearly completed the initial round of treatment.  Has no lesions currently. No improvement with over the counter medications or other home remedies. Denies other complaint or health concern today.   There are no active problems to display for this patient.   Past Medical History  Diagnosis Date  . Diabetes mellitus without complication   . Hyperlipidemia   . Rosacea   . Low testosterone     Past Surgical History  Procedure Laterality Date  . Appendectomy      History  Substance Use Topics  . Smoking status: Former Smoker -- 20 years    Types: Cigarettes    Quit date: 03/17/1992  . Smokeless tobacco: Not on file  . Alcohol Use: Not on file    No family history on file.  No Known Allergies  Medication list has been reviewed and updated.  Current Outpatient Prescriptions on File Prior to Visit  Medication Sig Dispense Refill  . metFORMIN (GLUCOPHAGE) 500 MG tablet Take 500 mg by mouth 2 (two) times daily with a meal.      . simvastatin (ZOCOR) 20 MG tablet Take 20 mg by mouth every evening.      . Testosterone 10 MG/ACT (2%) GEL Place 40 mg onto the skin daily. Apply 2 pumps to each thigh daily.  60 g  2  . valACYclovir (VALTREX) 1000 MG tablet Take 1 tablet (1,000 mg total) by mouth 3 (three) times daily.  30 tablet  0  . valACYclovir (VALTREX) 1000 MG tablet Take 1 tablet (1,000 mg total) by mouth daily.  30 tablet  12  . vardenafil (LEVITRA) 20 MG tablet Take 1 tablet (20 mg total) by mouth daily as needed for erectile dysfunction.  10 tablet  12    No current facility-administered medications on file prior to visit.    Review of Systems:  As per HPI, otherwise negative.    Physical Examination: Filed Vitals:   11/03/13 1547  BP: 120/70  Pulse: 65  Temp: 98 F (36.7 C)  Resp: 17   Filed Vitals:   11/03/13 1547  Height: 6' 0.5" (1.842 m)  Weight: 192 lb (87.091 kg)   Body mass index is 25.67 kg/(m^2). Ideal Body Weight: Weight in (lb) to have BMI = 25: 186.5   GEN: WDWN, NAD, Non-toxic, Alert & Oriented x 3 HEENT: Atraumatic, Normocephalic.  Ears and Nose: No external deformity. EXTR: No clubbing/cyanosis/edema NEURO: Normal gait.  PSYCH: Normally interactive. Conversant. Not depressed or anxious appearing.  Calm demeanor.    Assessment and Plan: Genital herpes Continue medications Practice safe sex   Signed,  Ellison Carwin, MD

## 2014-11-07 ENCOUNTER — Other Ambulatory Visit: Payer: Self-pay | Admitting: Emergency Medicine

## 2014-11-21 ENCOUNTER — Encounter: Payer: Self-pay | Admitting: *Deleted

## 2014-12-14 ENCOUNTER — Ambulatory Visit (INDEPENDENT_AMBULATORY_CARE_PROVIDER_SITE_OTHER): Payer: Medicare Other | Admitting: Family Medicine

## 2014-12-14 VITALS — BP 118/62 | HR 84 | Temp 97.4°F | Resp 18 | Ht 72.5 in | Wt 193.6 lb

## 2014-12-14 DIAGNOSIS — Z202 Contact with and (suspected) exposure to infections with a predominantly sexual mode of transmission: Secondary | ICD-10-CM | POA: Diagnosis not present

## 2014-12-14 DIAGNOSIS — R894 Abnormal immunological findings in specimens from other organs, systems and tissues: Secondary | ICD-10-CM | POA: Diagnosis not present

## 2014-12-14 DIAGNOSIS — R768 Other specified abnormal immunological findings in serum: Secondary | ICD-10-CM

## 2014-12-14 LAB — HIV ANTIBODY (ROUTINE TESTING W REFLEX): HIV 1&2 Ab, 4th Generation: NONREACTIVE

## 2014-12-14 LAB — RPR: RPR Ser Ql: REACTIVE — AB

## 2014-12-14 LAB — RPR TITER

## 2014-12-14 MED ORDER — VALACYCLOVIR HCL 1 G PO TABS: ORAL_TABLET | ORAL | Status: DC

## 2014-12-14 NOTE — Patient Instructions (Addendum)
Please try 1/2 tablet by mouth daily.  Consider coming off your suppressive therapy.

## 2014-12-14 NOTE — Progress Notes (Signed)
   Subjective:    Patient ID: [name], male    DOB: 27-Dec-1945, 69 y.o.   MRN: [id number]  HPI Patient presents today for refill of his Valtrex. He is taking 1000 mg daily and has not had further outbreaks.  He had one initial outbreak 3/15. He continues daily suppressive therapy. He is sexually active with women. He reports condom use 90% of the time and desires STI testing today. Last STI testing 3/15. 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   He sees Dr. Alyson Ingles regularly for primary care and has an appointment 7/16.   Past Medical History  Diagnosis Date  . Diabetes mellitus without complication   . Hyperlipidemia   . Rosacea   . Low testosterone    Past Surgical History  Procedure Laterality Date  . Appendectomy     History reviewed. No pertinent family history. History  Substance Use Topics  . Smoking status: Former Smoker -- 20 years    Types: Cigarettes    Quit date: 03/17/1992  . Smokeless tobacco: Not on file  . Alcohol Use: Not on file   Medications, allergies, past medical history, surgical history, family history, social history and problem list reviewed and updated.   Review of Systems No chest pain, no SOB, no fever or chills, no rash, no discharge.      Objective:   Physical Exam Physical Exam  Constitutional: Oriented to person, place, and time. He appears well-developed and well-nourished.  HENT:  Head: Normocephalic and atraumatic.  Eyes: Conjunctivae are normal.  Neck: Normal range of motion. Neck supple.  Cardiovascular: Normal rate, regular rhythm and normal heart sounds.   Pulmonary/Chest: Effort normal and breath sounds normal.  Musculoskeletal: Normal range of motion.  Neurological: Alert and oriented to person, place, and time.  Skin: Skin is warm and dry.  Psychiatric: Normal mood and affect. Behavior is normal. Judgment and thought content normal.  Vitals reviewed.  BP 118/62 mmHg  Pulse 84  Temp(Src) 97.4 F (36.3 C) (Oral)  Resp 18   Ht 6' 0.5" (1.842 m)  Wt 193 lb 9.6 oz (87.816 kg)  BMI 25.88 kg/m2  SpO2 98%     Assessment & Plan:  1. Possible exposure to STD - HIV antibody - RPR - GC/Chlamydia Probe Amp - encouraged condom use every time  2. HSV-2 seropositive - Discussed suppressive therapy with patient who is reluctant to stop or decrease his dose. I encouraged him to at least decrease dose to 500 mg daily and to consider stopping suppressive therapy and seeing if he has further outbreaks.  - valACYclovir (VALTREX) 1000 MG tablet; TAKE 1 TABLET (1,000 MG TOTAL) BY MOUTH DAILY.  Dispense: 90 tablet; Refill: [address], FNP-BC  [facility]  12/14/2014 11:22 AM

## 2014-12-15 LAB — GC/CHLAMYDIA PROBE AMP
CT PROBE, AMP APTIMA: NEGATIVE
GC PROBE AMP APTIMA: NEGATIVE

## 2014-12-15 LAB — FLUORESCENT TREPONEMAL AB(FTA)-IGG-BLD: FLUORESCENT TREPONEMAL ABS: NONREACTIVE

## 2015-08-15 ENCOUNTER — Encounter (HOSPITAL_COMMUNITY): Payer: Self-pay

## 2015-08-15 ENCOUNTER — Emergency Department (HOSPITAL_COMMUNITY): Payer: Medicare Other

## 2015-08-15 ENCOUNTER — Ambulatory Visit (HOSPITAL_BASED_OUTPATIENT_CLINIC_OR_DEPARTMENT_OTHER)
Admission: RE | Admit: 2015-08-15 | Discharge: 2015-08-15 | Disposition: A | Discharge status: Home / Self Care | Payer: Medicare Other | Source: Ambulatory Visit | Attending: Family Medicine | Admitting: Family Medicine

## 2015-08-15 ENCOUNTER — Other Ambulatory Visit (HOSPITAL_COMMUNITY): Payer: Self-pay | Admitting: Family Medicine

## 2015-08-15 ENCOUNTER — Inpatient Hospital Stay (HOSPITAL_COMMUNITY)
Admission: EM | Admit: 2015-08-15 | Discharge: 2015-08-16 | DRG: 176 | Disposition: A | Discharge status: Home / Self Care | Payer: Medicare Other | Attending: Family Medicine | Admitting: Family Medicine

## 2015-08-15 DIAGNOSIS — R7303 Prediabetes: Secondary | ICD-10-CM | POA: Diagnosis present

## 2015-08-15 DIAGNOSIS — I82411 Acute embolism and thrombosis of right femoral vein: Secondary | ICD-10-CM | POA: Diagnosis present

## 2015-08-15 DIAGNOSIS — I824Z1 Acute embolism and thrombosis of unspecified deep veins of right distal lower extremity: Secondary | ICD-10-CM | POA: Diagnosis present

## 2015-08-15 DIAGNOSIS — R52 Pain, unspecified: Secondary | ICD-10-CM

## 2015-08-15 DIAGNOSIS — I2699 Other pulmonary embolism without acute cor pulmonale: Principal | ICD-10-CM

## 2015-08-15 DIAGNOSIS — I82431 Acute embolism and thrombosis of right popliteal vein: Secondary | ICD-10-CM | POA: Diagnosis present

## 2015-08-15 DIAGNOSIS — M79661 Pain in right lower leg: Secondary | ICD-10-CM | POA: Insufficient documentation

## 2015-08-15 DIAGNOSIS — Z79899 Other long term (current) drug therapy: Secondary | ICD-10-CM | POA: Diagnosis not present

## 2015-08-15 DIAGNOSIS — Z87891 Personal history of nicotine dependence: Secondary | ICD-10-CM

## 2015-08-15 DIAGNOSIS — M7989 Other specified soft tissue disorders: Secondary | ICD-10-CM | POA: Insufficient documentation

## 2015-08-15 DIAGNOSIS — E785 Hyperlipidemia, unspecified: Secondary | ICD-10-CM | POA: Diagnosis present

## 2015-08-15 DIAGNOSIS — I82441 Acute embolism and thrombosis of right tibial vein: Secondary | ICD-10-CM | POA: Diagnosis present

## 2015-08-15 DIAGNOSIS — Z85828 Personal history of other malignant neoplasm of skin: Secondary | ICD-10-CM

## 2015-08-15 DIAGNOSIS — Z86718 Personal history of other venous thrombosis and embolism: Secondary | ICD-10-CM

## 2015-08-15 DIAGNOSIS — R0602 Shortness of breath: Secondary | ICD-10-CM | POA: Diagnosis present

## 2015-08-15 DIAGNOSIS — I2692 Saddle embolus of pulmonary artery without acute cor pulmonale: Secondary | ICD-10-CM | POA: Diagnosis not present

## 2015-08-15 HISTORY — DX: Pneumonia, unspecified organism: J18.9

## 2015-08-15 HISTORY — DX: Prediabetes: R73.03

## 2015-08-15 HISTORY — DX: Other pulmonary embolism without acute cor pulmonale: I26.99

## 2015-08-15 HISTORY — DX: Unspecified malignant neoplasm of skin, unspecified: C44.90

## 2015-08-15 LAB — CBC
HCT: 45.6 % (ref 39.0–52.0)
Hemoglobin: 16 g/dL (ref 13.0–17.0)
MCH: 34.6 pg — AB (ref 26.0–34.0)
MCHC: 35.1 g/dL (ref 30.0–36.0)
MCV: 98.5 fL (ref 78.0–100.0)
PLATELETS: 149 10*3/uL — AB (ref 150–400)
RBC: 4.63 MIL/uL (ref 4.22–5.81)
RDW: 13 % (ref 11.5–15.5)
WBC: 8.1 10*3/uL (ref 4.0–10.5)

## 2015-08-15 LAB — BASIC METABOLIC PANEL
Anion gap: 9 (ref 5–15)
BUN: 16 mg/dL (ref 6–20)
CALCIUM: 9.7 mg/dL (ref 8.9–10.3)
CO2: 26 mmol/L (ref 22–32)
CREATININE: 1.11 mg/dL (ref 0.61–1.24)
Chloride: 105 mmol/L (ref 101–111)
GFR calc Af Amer: >60 mL/min (ref >60)
GLUCOSE: 134 mg/dL — AB (ref 65–99)
Potassium: 4.6 mmol/L (ref 3.5–5.1)
Sodium: 140 mmol/L (ref 135–145)

## 2015-08-15 LAB — PROTIME-INR
INR: 1.15 (ref 0.00–1.49)
Prothrombin Time: 14.9 seconds (ref 11.6–15.2)

## 2015-08-15 LAB — I-STAT TROPONIN, ED: TROPONIN I, POC: 0 ng/mL (ref 0.00–0.08)

## 2015-08-15 LAB — D-DIMER, QUANTITATIVE (NOT AT ARMC): D DIMER QUANT: 14.32 ug{FEU}/mL — AB (ref 0.00–0.50)

## 2015-08-15 MED ORDER — IOHEXOL 350 MG/ML SOLN
80.0000 mL | Freq: Once | INTRAVENOUS | Status: AC | PRN
  Administered 2015-08-15: 100 mL via INTRAVENOUS

## 2015-08-15 MED ORDER — HYDROCODONE-ACETAMINOPHEN 5-325 MG PO TABS: 1.0000 | ORAL_TABLET | ORAL | Status: DC | PRN

## 2015-08-15 MED ORDER — ACETAMINOPHEN 325 MG PO TABS: 650.0000 mg | ORAL_TABLET | Freq: Four times a day (QID) | ORAL | Status: DC | PRN

## 2015-08-15 MED ORDER — HEPARIN (PORCINE) IN NACL 100-0.45 UNIT/ML-% IJ SOLN
1500.0000 [IU]/h | INTRAMUSCULAR | Status: DC
  Administered 2015-08-15 – 2015-08-16 (×2): 1500 [IU]/h via INTRAVENOUS
  Filled 2015-08-15 (×2): qty 250

## 2015-08-15 MED ORDER — ACETAMINOPHEN 650 MG RE SUPP: 650.0000 mg | Freq: Four times a day (QID) | RECTAL | Status: DC | PRN

## 2015-08-15 MED ORDER — SODIUM CHLORIDE 0.9 % IJ SOLN
3.0000 mL | Freq: Two times a day (BID) | INTRAMUSCULAR | Status: DC
  Administered 2015-08-15: 3 mL via INTRAVENOUS

## 2015-08-15 MED ORDER — ATORVASTATIN CALCIUM 40 MG PO TABS
40.0000 mg | ORAL_TABLET | Freq: Every day | ORAL | Status: DC
  Administered 2015-08-16: 40 mg via ORAL
  Filled 2015-08-15: qty 1

## 2015-08-15 MED ORDER — HEPARIN BOLUS VIA INFUSION
5000.0000 [IU] | Freq: Once | INTRAVENOUS | Status: AC
  Administered 2015-08-15: 5000 [IU] via INTRAVENOUS
  Filled 2015-08-15: qty 5000

## 2015-08-15 NOTE — ED Notes (Signed)
MD at bedside. 

## 2015-08-15 NOTE — H&P (Signed)
History and Physical  Patient Name: [name]     [id number]    DOB: Dec 31, 1945    DOA: 08/15/2015 Referring physician: Malvin Johns PCP: [name], MD      Chief Complaint: Leg swelling  HPI: [name] is a 70 y.o. male with a past medical history significant for remote DVT and low testosterone who presents with leg swelling.  The patient was in his usual state of health until recently when he noted right leg swelling and calf pain.  He has also noted some SOB earlier than usual in his regular jog.  He saw his PCP today who sent him for LE doppler which showed extensive RLE clot.   In the ED, the patient had normal HR and BP.  A CTA showed bilateral thrombus to the level of the distal main pulmonary arteries.  TRH were asked to evaluate for admission.  He has no SOB at rest and no chest pain.  He has had no syncope or pre-syncope.  He denies recent surgery, weight loss or long trips.  He uses testosterone for the last year for low testosterone and sexual performance.     Review of Systems:  Pt complains of leg swelling, leg pain, SOB. Pt denies any chest pain, syncope, pre-syncope, tachycardia.  All other systems negative except as just noted or noted in the history of present illness.   Allergies: No Known Allergies  Home medications: 1. Simvastatin 80 mg daily 2. Minocycline for rosacea 3. Levitra 4. Topical testosterone 5. Penile injections for ED  Past medical history: 1. Pre-diabetes 2. Hyperlipidemia  Past surgical history: 1. Appendectomy 2. Cholecystectomy  Family history:  Mother, cancer, unknown type. Father, MI, premature. No family history of DVT/PE.    Social History:  Patient lives with his wife.  He has a remote former smoking history.  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  He is physically active.        Physical Exam: BP 147/69 mmHg  Pulse 54  Temp(Src) 98.3 F (36.8 C) (Oral)  Resp 18  Ht 6\' 2"  (1.88 m)   Wt 87.544 kg (193 lb)  BMI 24.77 kg/m2  SpO2 97% General appearance: Well-developed, adult male, alert and in no acute distress.   Eyes: Anicteric, conjunctiva pink, lids and lashes normal.     ENT: No nasal deformity, discharge, or epistaxis.  OP moist without lesions.   Lymph: No cervical, supraclavicular lymphadenopathy. Skin: Warm and dry.  Mild rosacea. Cardiac: RRR, nl S1-S2, no murmurs appreciated.  Capillary refill is brisk.  JVP normal.  R sided LE edema, some dilated surface veins, mild pitting.  Negative Homans sign.  Tenderness to palpation in right popliteal fossa.  Radial pulses 2+ and symmetric. Respiratory: Normal respiratory rate and rhythm.  CTAB without rales or wheezes. Abdomen: Abdomen soft without rigidity.  No TTP. No ascites, distension.   MSK: No deformities or effusions. Neuro: Sensorium intact and responding to questions, attention normal.  Speech is fluent.  Moves all extremities equally and with normal coordination.    Psych: Behavior appropriate.  Affect normal.  No evidence of aural or visual hallucinations or delusions.       Labs on Admission:  The metabolic panel shows normal electrolytes and renal function. D-dimer elevated. The complete blood count shows no leukocytosis or anemia or thrombocytopenia.   INR normal.  TNI negative.   Radiological Exams on Admission: Personally reviewed: Dg Chest 2 View 08/15/2015   Clear  Ct Angio Chest Pe W/cm &/or Wo Cm 08/15/2015   Moderate to large clot burden bilaterally, starting at the distal main pulmonary arteries.    EKG: Independently reviewed. Rate 64.  Normal QTc.  Sinus, no ST or TW changes or evidence of R heart strain.    Assessment/Plan 1. PE:  This is new.  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  The patient currently is taking topical testosterone supplements, but denies recent surgery, travel, weight loss. -Heparin gtt -Routine CBC and heparin level per  pharmacy -Care mgmt consult re: coverage of NOACs -Echocardiogram tomorrow -Bilat LE venous dopplers completed today already -Consult to pulmonology, appreciate cares  2. Hyperlipidemia:  -Continue home statin, per formulary      DVT PPx: Heparin Diet: Regular Consultants: Pulm and CM Code Status: Full Family Communication: None  Medical decision making: What exists of the patient's previous chart was reviewed in depth and the case was discussed with Dr. Tamera Punt and Dr. Alva Garnet. Patient seen 10:57 PM on 08/15/2015.  Disposition Plan:  Admit to inpatient for submassive PE with hemodynamic stability.  Initiate IV heparin and anticipate transition to oral AC within the next few days.  Echocardiogram and specialty consultation tomorrow.      Edwin Dada [facility] Pager [phone]

## 2015-08-15 NOTE — ED Provider Notes (Signed)
CSN: [id number]     Arrival date & time 08/15/15  1418 History   First MD Initiated Contact with Patient 08/15/15 2000     Chief Complaint  Patient presents with  . Shortness of Breath  . known DVT      (Consider location/radiation/quality/duration/timing/severity/associated sxs/prior Treatment) HPI Comments: Patient presents with shortness of breath. He has been diagnosed with a DVT earlier today. This is in his right leg. He's previously had a DVT in his left leg 2 years ago which was felt to be related to a long car trip. This new DVT occurred spontaneously. He mentioned this this morning to his PCP that yesterday when he was jogging he felt more short of breath than he normally does. Given this he was sent over here for a CT chest. He currently doesn't complain of shortness of breath. He hasn't had any other episodes of shortness of breath other than feeling more short of breath during his recent jogging episode. He has no chest pain. He was given a prescription for Xarelto to start should his ultrasound be positive for DVT however he is not yet started it given the fact that he is waiting for a CT chest.  Patient is a 70 y.o. male presenting with shortness of breath.  Shortness of Breath Associated symptoms: no abdominal pain, no chest pain, no cough, no diaphoresis, no fever, no headaches, no rash and no vomiting     Past Medical History  Diagnosis Date  . Diabetes mellitus without complication ([place])   . Hyperlipidemia   . Rosacea   . Low testosterone    Past Surgical History  Procedure Laterality Date  . Appendectomy     No family history on file. Social History  Substance Use Topics  . Smoking status: Former Smoker -- 20 years    Types: Cigarettes    Quit date: 03/17/1992  . Smokeless tobacco: None  . Alcohol Use: None    Review of Systems  Constitutional: Negative for fever, chills, diaphoresis and fatigue.  HENT: Negative for congestion, rhinorrhea and sneezing.    Eyes: Negative.   Respiratory: Positive for shortness of breath. Negative for cough and chest tightness.   Cardiovascular: Positive for leg swelling. Negative for chest pain.  Gastrointestinal: Negative for nausea, vomiting, abdominal pain, diarrhea and blood in stool.  Genitourinary: Negative for frequency, hematuria, flank pain and difficulty urinating.  Musculoskeletal: Negative for back pain and arthralgias.  Skin: Negative for rash.  Neurological: Negative for dizziness, speech difficulty, weakness, numbness and headaches.      Allergies  Review of patient's allergies indicates no known allergies.  Home Medications   Prior to Admission medications   Medication Sig Start Date End Date Taking? Authorizing Provider  minocycline (MINOCIN,DYNACIN) 100 MG capsule Take 100 mg by mouth 2 (two) times a week.    Yes Historical Provider, MD  Multiple Vitamin (MULTIVITAMIN WITH MINERALS) TABS tablet Take 1 tablet by mouth daily.   Yes Historical Provider, MD  simvastatin (ZOCOR) 80 MG tablet Take 80 mg by mouth at bedtime. 06/18/15  Yes Historical Provider, MD  Testosterone 10 MG/ACT (2%) GEL Place 40 mg onto the skin daily. Apply 2 pumps to each thigh daily. Patient not taking: Reported on 08/15/2015 07/05/12   [name], MD  valACYclovir (VALTREX) 1000 MG tablet TAKE 1 TABLET (1,000 MG TOTAL) BY MOUTH DAILY. Patient not taking: Reported on 08/15/2015 12/14/14   Elby Beck, FNP  vardenafil (LEVITRA) 20 MG tablet Take 1 tablet (  20 mg total) by mouth daily as needed for erectile dysfunction. 04/04/12 11/03/13  [name], MD   BP 147/69 mmHg  Pulse 54  Temp(Src) 98.3 F (36.8 C) (Oral)  Resp 18  Ht 6\' 2"  (1.88 m)  Wt 193 lb (87.544 kg)  BMI 24.77 kg/m2  SpO2 97% Physical Exam  Constitutional: He is oriented to person, place, and time. He appears well-developed and well-nourished.  HENT:  Head: Normocephalic and atraumatic.  Eyes: Pupils are equal, round, and  reactive to light.  Neck: Normal range of motion. Neck supple.  Cardiovascular: Normal rate, regular rhythm and normal heart sounds.   Pulmonary/Chest: Effort normal and breath sounds normal. No respiratory distress. He has no wheezes. He has no rales. He exhibits no tenderness.  Abdominal: Soft. Bowel sounds are normal. There is no tenderness. There is no rebound and no guarding.  Musculoskeletal: Normal range of motion. He exhibits edema.  Positive swelling of the right lower extremity. Pedal pulses are intact. There is no erythema. There is mild warmth to the area.  Lymphadenopathy:    He has no cervical adenopathy.  Neurological: He is alert and oriented to person, place, and time.  Skin: Skin is warm and dry. No rash noted.  Psychiatric: He has a normal mood and affect.    ED Course  Procedures (including critical care time) Labs Review Labs Reviewed  BASIC METABOLIC PANEL - Abnormal; Notable for the following:    Glucose, Bld 134 (*)    All other components within normal limits  CBC - Abnormal; Notable for the following:    MCH 34.6 (*)    Platelets 149 (*)    All other components within normal limits  D-DIMER, QUANTITATIVE (NOT AT [facility]) - Abnormal; Notable for the following:    D-Dimer, Quant 14.32 (*)    All other components within normal limits  PROTIME-INR  Randolm Idol, ED    Imaging Review Dg Chest 2 View  08/15/2015  CLINICAL DATA:  Shortness of breath. EXAM: CHEST  2 VIEW COMPARISON:  None. FINDINGS: The heart, hila, and mediastinum are normal. No pulmonary nodules, masses, or infiltrates. No pneumothorax. Blunting of the right costophrenic angle, only seen on the lateral view, suggesting tiny effusion. IMPRESSION: Probable tiny right pleural effusion with blunting of the costophrenic angle on the lateral view. No other suspicious findings. Electronically Signed   By: Dorise Bullion III M.D   On: 08/15/2015 14:59   Ct Angio Chest Pe W/cm &/or Wo Cm  08/15/2015   CLINICAL DATA:  Shortness of breath and deep venous thrombosis EXAM: CT ANGIOGRAPHY CHEST WITH CONTRAST TECHNIQUE: Multidetector CT imaging of the chest was performed using the standard protocol during bolus administration of intravenous contrast. Multiplanar CT image reconstructions and MIPs were obtained to evaluate the vascular anatomy. CONTRAST:  14mL OMNIPAQUE IOHEXOL 350 MG/ML SOLN COMPARISON:  Chest radiograph August 15, 2015 FINDINGS: There is extensive pulmonary embolus arising in both distal main pulmonary arteries with extension into multiple upper and lower lobe branches bilaterally. The right ventricle to left ventricle diameter ratio is less than 0.9, not consistent with right heart strain. There is no appreciable thoracic aortic aneurysm or dissection. The visualized great vessels appear unremarkable. Pericardium is not thickened. There is left ventricular hypertrophy. There are foci of coronary artery calcification. There is mild bibasilar atelectatic change. There is no lung edema or consolidation. Visualized thyroid appears normal. There is no demonstrable thoracic adenopathy. There is reflux of contrast into the inferior vena  cava, a finding suggesting increase in right heart pressure. Visualized upper abdominal structures appear unremarkable. There is degenerative change in the thoracic spine with diffuse idiopathic skeletal hyperostosis. No blastic or lytic bone lesions are identified. Review of the MIP images confirms the above findings. IMPRESSION: Extensive pulmonary embolus bilaterally without demonstrable right heart strain. Mild bibasilar atelectasis. No lung edema or consolidation. No demonstrable adenopathy. Foci of coronary artery calcification present. There is reflux of contrast into the inferior vena cava, a finding suggesting increase in right heart pressure. Critical Value/emergent results were called by telephone at the time of interpretation on 08/15/2015 at 9:18 pm to Dr.  Malvin Johns , who verbally acknowledged these results. Electronically Signed   By: Lowella Grip III M.D.   On: 08/15/2015 21:20   I have personally reviewed and evaluated these images and lab results as part of my medical decision-making.   EKG Interpretation   Date/Time:  Tuesday August 15 2015 14:43:17 EST Ventricular Rate:  64 PR Interval:  182 QRS Duration: 88 QT Interval:  406 QTC Calculation: 418 R Axis:   32 Text Interpretation:  Normal sinus rhythm Anteroseptal infarct , age  undetermined Abnormal ECG since last tracing no significant change  Confirmed by Annelisa Ryback  MD, Tyana Butzer ([id number]) on 08/15/2015 8:34:17 PM      MDM   Final diagnoses:  Pulmonary embolism, other ([place])   Patient presents with shortness of breath only during running yesterday. Given the symptoms with a history of recently diagnosed DVT, he had a CT angiogram which did show large bilateral emboli. There is no evidence of heart strain on CT per the radiologist. Patient's vital signs are stable. He has no hypoxia. I discussed the patient with Dr. Loleta Books with the hospitalist service to admit the patient for observation to telemetry floor. Dr. Loleta Books states that he will start the patient on anticoagulants.     Malvin Johns, MD 08/15/15 2223

## 2015-08-15 NOTE — Progress Notes (Addendum)
Preliminary results by tech - Venous Duplex Lower Ext. Completed. Positive for an acute deep vein thrombosis involving the femoral vein, popliteal vein, posterior tibial veins, peroneal veins and gastro veins in the right leg. No evidence of acute deep vein thrombosis in the left leg. Results given to Dr. Kenton Kingfisher at [phone] and patient was instructed to go to the ER. Oda Cogan, BS, RDMS, RVT

## 2015-08-15 NOTE — ED Notes (Signed)
Patient here to be seen for possible PE, diagnosed with DVT earlier today, reports that he normally jogs daily but increasing shortness of breath, no acute distress

## 2015-08-15 NOTE — ED Notes (Signed)
Admitting at bedside 

## 2015-08-15 NOTE — Progress Notes (Signed)
ANTICOAGULATION CONSULT NOTE - Initial Consult  Pharmacy Consult for Heparin  Indication: pulmonary embolus and DVT  No Known Allergies  Patient Measurements: Height: 6\' 2"  (188 cm) Weight: 193 lb (87.544 kg) IBW/kg (Calculated) : 82.2  Vital Signs: Temp: 98.3 F (36.8 C) (01/17 1629) Temp Source: Oral (01/17 1629) BP: 147/69 mmHg (01/17 2130) Pulse Rate: 54 (01/17 2130)  Labs:  Recent Labs  08/15/15 1440  HGB 16.0  HCT 45.6  PLT 149*  LABPROT 14.9  INR 1.15  CREATININE 1.11    Estimated Creatinine Clearance: 75.1 mL/min (by C-G formula based on Cr of 1.11).   Medical History: Past Medical History  Diagnosis Date  . Diabetes mellitus without complication ([place])   . Hyperlipidemia   . Rosacea   . Low testosterone     Assessment: Diagnosed with DVT as outpatient earlier today, sent to ED for CT to rule out PE, CT Angio here shows extensive PE, H/H good, Plts 149, renal function good, other labs reviewed. Pt has not received any anti-coagulation at this point.   Goal of Therapy:  Heparin level 0.3-0.7 units/ml Monitor platelets by anticoagulation protocol: Yes   Plan:  -Heparin 5000 units BOLUS -Start heparin drip at 1500 units/hr -0730 HL -Daily CBC/HL -Monitor for bleeding  Narda Bonds 08/15/2015,11:01 PM

## 2015-08-15 NOTE — ED Notes (Signed)
CT informed that pt is now in room

## 2015-08-15 NOTE — ED Notes (Signed)
Unable to locate when CT called for study

## 2015-08-15 NOTE — ED Notes (Signed)
CT chest orders received from [facility] MD

## 2015-08-15 NOTE — ED Notes (Signed)
Patient transported to CT 

## 2015-08-16 ENCOUNTER — Inpatient Hospital Stay (HOSPITAL_COMMUNITY): Payer: Medicare Other

## 2015-08-16 ENCOUNTER — Encounter (HOSPITAL_COMMUNITY): Payer: Self-pay | Admitting: General Practice

## 2015-08-16 DIAGNOSIS — I2699 Other pulmonary embolism without acute cor pulmonale: Principal | ICD-10-CM

## 2015-08-16 DIAGNOSIS — I2692 Saddle embolus of pulmonary artery without acute cor pulmonale: Secondary | ICD-10-CM

## 2015-08-16 LAB — CBC
HEMATOCRIT: 43.2 % (ref 39.0–52.0)
HEMOGLOBIN: 14.8 g/dL (ref 13.0–17.0)
MCH: 33.7 pg (ref 26.0–34.0)
MCHC: 34.3 g/dL (ref 30.0–36.0)
MCV: 98.4 fL (ref 78.0–100.0)
Platelets: 153 10*3/uL (ref 150–400)
RBC: 4.39 MIL/uL (ref 4.22–5.81)
RDW: 12.9 % (ref 11.5–15.5)
WBC: 8.8 10*3/uL (ref 4.0–10.5)

## 2015-08-16 LAB — HEPARIN LEVEL (UNFRACTIONATED): HEPARIN UNFRACTIONATED: 0.53 [IU]/mL (ref 0.30–0.70)

## 2015-08-16 MED ORDER — RIVAROXABAN 15 MG PO TABS
15.0000 mg | ORAL_TABLET | Freq: Two times a day (BID) | ORAL | Status: DC
  Administered 2015-08-16: 15 mg via ORAL
  Filled 2015-08-16: qty 1

## 2015-08-16 MED ORDER — RIVAROXABAN (XARELTO) VTE STARTER PACK (15 & 20 MG): ORAL_TABLET | ORAL | Status: DC

## 2015-08-16 NOTE — Discharge Instructions (Signed)
Information on my medicine - XARELTO (rivaroxaban)  This medication education was reviewed with me or my healthcare representative as part of my discharge preparation.    WHY WAS XARELTO PRESCRIBED FOR YOU? Xarelto was prescribed to treat blood clots that may have been found in the veins of your legs (deep vein thrombosis) or in your lungs (pulmonary embolism) and to reduce the risk of them occurring again.  What do you need to know about Xarelto? The starting dose is one 15 mg tablet taken TWICE daily with food for the FIRST 21 DAYS then on 09/07/15  the dose is changed to one 20 mg tablet taken ONCE A DAY with your evening meal.  DO NOT stop taking Xarelto without talking to the health care provider who prescribed the medication.  Refill your prescription for 20 mg tablets before you run out.  After discharge, you should have regular check-up appointments with your healthcare provider that is prescribing your Xarelto.  In the future your dose may need to be changed if your kidney function changes by a significant amount.  What do you do if you miss a dose? If you are taking Xarelto TWICE DAILY and you miss a dose, take it as soon as you remember. You may take two 15 mg tablets (total 30 mg) at the same time then resume your regularly scheduled 15 mg twice daily the next day.  If you are taking Xarelto ONCE DAILY and you miss a dose, take it as soon as you remember on the same day then continue your regularly scheduled once daily regimen the next day. Do not take two doses of Xarelto at the same time.   Important Safety Information Xarelto is a blood thinner medicine that can cause bleeding. You should call your healthcare provider right away if you experience any of the following: ? Bleeding from an injury or your nose that does not stop. ? Unusual colored urine (red or dark brown) or unusual colored stools (red or black). ? Unusual bruising for unknown reasons. ? A serious fall or  if you hit your head (even if there is no bleeding).  Some medicines may interact with Xarelto and might increase your risk of bleeding while on Xarelto. To help avoid this, consult your healthcare provider or pharmacist prior to using any new prescription or non-prescription medications, including herbals, vitamins, non-steroidal anti-inflammatory drugs (NSAIDs) and supplements.  This website has more information on Xarelto: [email or web address]

## 2015-08-16 NOTE — Progress Notes (Signed)
*  PRELIMINARY RESULTS* Echocardiogram 2D Echocardiogram has been performed.  Leavy Cella 08/16/2015, 10:11 AM

## 2015-08-16 NOTE — Care Management Note (Addendum)
Case Management Note  Patient Details  Name: [name] MRN: [id number] Date of Birth: 12-23-1945  Subjective/Objective:    Pt admitted with positive bilateral PE's and LE DVT                Action/Plan:  Pt is independent from home with wife.  CM received consult for expense related to Xarelto vs Eliquis.  CM submitted benefit check   Expected Discharge Date:                  Expected Discharge Plan:  Home/Self Care  In-House Referral:     Discharge planning Services  CM Consult, Medication Assistance  Post Acute Care Choice:    Choice offered to:     DME Arranged:    DME Agency:     HH Arranged:    [place] Agency:     Status of Service:  Completed, signed off  Medicare Important Message Given:    Date Medicare IM Given:    Medicare IM give by:    Date Additional Medicare IM Given:    Additional Medicare Important Message give by:     If discussed at [place] of Stay Meetings, dates discussed:    Additional Comments: Benefit Check Per rep at [facility]:  auth required [id number]  Tier 2, $35 for 30 day supply retail/ $70 for 90 day mail order   Eliquis:  auth required [phone]  Tier 2, $35 for 30 day supply retail/ $70 for 90 day mail order       Previous Messages      CM informed pt of copay.  Pt will discharge home on Xarelto, CM contacted pharmacy of choice [facility] and was informed that pharmacy can fill medication today.  CM provided pt with free 30 day card.  Attending notified that prior auth is required; requested information be provided to pt for pt to follow up with PCP to ensure prior Josem Kaufmann is completed prior to 30 day supply running out.  CM provided prior auth information to pt via [employer] Maryclare Labrador, RN 08/17/2015, 8:47 AM

## 2015-08-16 NOTE — Progress Notes (Signed)
ANTICOAGULATION CONSULT NOTE -  Pharmacy Consult for  Change from IV heparin drip to oral Xarelto Indication: pulmonary embolus and DVT  No Known Allergies  Patient Measurements: Height: 6\' 2"  (188 cm) Weight: 194 lb 9.6 oz (88.27 kg) IBW/kg (Calculated) : 82.2  Vital Signs: Temp: 98.4 F (36.9 C) (01/18 1312) Temp Source: Oral (01/18 1312) BP: 129/58 mmHg (01/18 1312) Pulse Rate: 59 (01/18 1312)  Labs:  Recent Labs  08/15/15 1440 08/16/15 0226 08/16/15 0820  HGB 16.0 14.8  --   HCT 45.6 43.2  --   PLT 149* 153  --   LABPROT 14.9  --   --   INR 1.15  --   --   HEPARINUNFRC  --   --  0.53  CREATININE 1.11  --   --     Estimated Creatinine Clearance: 75.1 mL/min (by C-G formula based on Cr of 1.11).   Medical History: Past Medical History  Diagnosis Date  . Hyperlipidemia   . Rosacea   . Low testosterone   . Skin cancer     "left groin area"  . Pulmonary embolism ([place]) 08/15/2015    "bilateral large clots"  . Walking pneumonia 1990s X 1  . Prediabetes     "prediabetic; lost weight; increased activity; now blood sugar is fine" (08/16/2015    Assessment: 70 y.o male started on anticoagulation with IV heparin infusion for submassive pulmonary emboli/DVT.This morning the heparin level was =0.53, therapeutic on heparin drip 1500 units/hr.  CBC wnl. No bleeding noted.   Now we will DC IV heparin and  change to oral XARELTO & as ordered per Dr. Raliegh Ip -for discharge home.  SCr 1.11,  Hgb 14.8 stable, pltc 153 up from 149K No bleeding note  Goal of Therapy:  Monitor platelets by anticoagulation protocol: Yes   Plan:  Discontinue heparin drip now and give 1st dose of Xarelto 15 mg po BID with meals x 21 days then 20mg  daily with supper for PE/DVT treatment dose.  Monitor for bleeding  Thank you for allowing pharmacy to be part of this patients care team. Nicole Cella, RPh Clinical Pharmacist Pager: [phone] 08/16/2015,2:34 PM

## 2015-08-16 NOTE — Progress Notes (Signed)
ANTICOAGULATION CONSULT NOTE -  Pharmacy Consult for Heparin  Indication: pulmonary embolus and DVT  No Known Allergies  Patient Measurements: Height: 6\' 2"  (188 cm) Weight: 194 lb 9.6 oz (88.27 kg) IBW/kg (Calculated) : 82.2  Vital Signs: Temp: 98.2 F (36.8 C) (01/18 1012) Temp Source: Oral (01/18 1012) BP: 135/65 mmHg (01/18 1012) Pulse Rate: 64 (01/18 1012)  Labs:  Recent Labs  08/15/15 1440 08/16/15 0226 08/16/15 0820  HGB 16.0 14.8  --   HCT 45.6 43.2  --   PLT 149* 153  --   LABPROT 14.9  --   --   INR 1.15  --   --   HEPARINUNFRC  --   --  0.53  CREATININE 1.11  --   --     Estimated Creatinine Clearance: 75.1 mL/min (by C-G formula based on Cr of 1.11).   Medical History: Past Medical History  Diagnosis Date  . Hyperlipidemia   . Rosacea   . Low testosterone   . Skin cancer     "left groin area"  . Pulmonary embolism ([place]) 08/15/2015    "bilateral large clots"  . Walking pneumonia 1990s X 1  . Prediabetes     "prediabetic; lost weight; increased activity; now blood sugar is fine" (08/16/2015    Assessment: 70 y.o male started on anticoagulation with IV heparin infusion for submassive pulmonary emboli/DVT. Baseline H/H good, Plts 149, renal function good.  Pt had not received any anti-coagulation prior to the start of IV heparin infusion last night.  This AM the first 8 hour heparin level is =0.53, therapeutic on heparin drip 1500 units/hr.  CBC wnl. No bleeding noted.  Goal of Therapy:  Heparin level 0.3-0.7 units/ml Monitor platelets by anticoagulation protocol: Yes   Plan:  Continue heparin drip at 1500 units/hr Recheck a 6h heparin level to confirm remains therapeutic then daily heparin level.  Daily CBC/HL Monitor for bleeding  Nicole Cella, RPh Clinical Pharmacist Pager: [phone] 08/16/2015,10:15 AM

## 2015-08-16 NOTE — Consult Note (Signed)
Name: [name] MRN: [id number] DOB: 02-Feb-1946    ADMISSION DATE:  08/15/2015 CONSULTATION DATE:  1/18  REFERRING MD :  Loleta Books   CHIEF COMPLAINT:  SubMassive Pulmonary Emboli   BRIEF PATIENT DESCRIPTION:  70 yom w/ recurrent VTE/PE, Admitted on 1/17 by [facility]. Hemodynamically stable. PCCM asked to make formal recs   SIGNIFICANT EVENTS    STUDIES:  CT chest (PE study) 1/18: Extensive pulmonary embolus bilaterally without demonstrable right heart strain (The RV:LV ratio is less than 0.9). Mild bibasilar atelectasis. No lung edema or consolidation. No demonstrable adenopathy. There is reflux of contrast into the inferior vena cava, a finding suggesting increase in right heart pressure. LE venous Doppler 1/18>>> ECHO 1/18>>>   HISTORY OF PRESENT ILLNESS:   70 y.o. male with a PMH of remote DVT, skin cancer & low testosterone (takes topical testosterone supplementation) who presented to his PCP on 1/17 w/ right LE swelling & calf pain.  He has also noted some SOB earlier than usual in his regular jog. He saw his PCP today who sent him for LE doppler which showed extensive RLE clot. In the ED, the patient had normal HR and BP.He has no SOB at rest and no chest pain. He has had no syncope or pre-syncope. He denied recent surgery, weight loss or long tripsA CTA showed bilateral thrombus to the level of the distal main pulmonary arteries. He was admitted by the hospitalist who have requested formal consult to assist w/ treatment recs.   PAST MEDICAL HISTORY :    has a past medical history of Hyperlipidemia; Rosacea; Low testosterone; Skin cancer; Pulmonary embolism ([place]) (08/15/2015); Walking pneumonia (1990s X 1); and Prediabetes.  has past surgical history that includes Appendectomy [id number]); Tonsillectomy (1950s); Cholecystectomy open (~ 2006); and ORIF tibia & fibula fractures (Right, 2005). Prior to Admission medications   Medication Sig Start Date End Date Taking? Authorizing  Provider  minocycline (MINOCIN,DYNACIN) 100 MG capsule Take 100 mg by mouth 2 (two) times a week.    Yes Historical Provider, MD  Multiple Vitamin (MULTIVITAMIN WITH MINERALS) TABS tablet Take 1 tablet by mouth daily.   Yes Historical Provider, MD  simvastatin (ZOCOR) 80 MG tablet Take 80 mg by mouth at bedtime. 06/18/15  Yes Historical Provider, MD  Testosterone 10 MG/ACT (2%) GEL Place 40 mg onto the skin daily. Apply 2 pumps to each thigh daily. Patient not taking: Reported on 08/15/2015 07/05/12   [name], MD  valACYclovir (VALTREX) 1000 MG tablet TAKE 1 TABLET (1,000 MG TOTAL) BY MOUTH DAILY. Patient not taking: Reported on 08/15/2015 12/14/14   Elby Beck, FNP  vardenafil (LEVITRA) 20 MG tablet Take 1 tablet (20 mg total) by mouth daily as needed for erectile dysfunction. 04/04/12 11/03/13  [name], MD   No Known Allergies  FAMILY HISTORY:  family history includes Cancer in his mother; Heart attack in his father. SOCIAL HISTORY:  reports that he has quit smoking. His smoking use included Cigarettes. He quit after 10 years of use. He has never used smokeless tobacco. He reports that he drinks about 1.2 oz of alcohol per week. He reports that he uses illicit drugs (Marijuana).  REVIEW OF SYSTEMS:   Constitutional: Negative for fever, chills, weight loss, malaise/fatigue and diaphoresis.  HENT: Negative for hearing loss, ear pain, nosebleeds, congestion, sore throat, neck pain, tinnitus and ear discharge.   Eyes: Negative for blurred vision, double vision, photophobia, pain, discharge and redness.  Respiratory: Negative for cough, hemoptysis,  sputum production, shortness of breath, only when running, wheezing and stridor.   Cardiovascular: Negative for chest pain, palpitations, orthopnea, claudication, leg swelling and PND.  Gastrointestinal: Negative for heartburn, nausea, vomiting, abdominal pain, diarrhea, constipation, blood in stool and melena.  Genitourinary:  Negative for dysuria, urgency, frequency, hematuria and flank pain.  Musculoskeletal: Negative for myalgias, back pain, joint pain and falls. Leg pain resolved  Skin: Negative for itching and rash.  Neurological: Negative for dizziness, tingling, tremors, sensory change, speech change, focal weakness, seizures, loss of consciousness, weakness and headaches.  Endo/Heme/Allergies: Negative for environmental allergies and polydipsia. Does not bruise/bleed easily.  SUBJECTIVE:   VITAL SIGNS: Temp:  [97.8 F (36.6 C)-98.3 F (36.8 C)] 98 F (36.7 C) (01/18 0402) Pulse Rate:  [54-79] 63 (01/18 0402) Resp:  [16-20] 18 (01/18 0402) BP: (125-164)/(66-84) 125/66 mmHg (01/18 0402) SpO2:  [94 %-99 %] 94 % (01/18 0402) Weight:  [87.544 kg (193 lb)-88.27 kg (194 lb 9.6 oz)] 88.27 kg (194 lb 9.6 oz) (01/17 2314) Room air  PHYSICAL EXAMINATION: General:  Well nourished and developed 70 yom, no distress Neuro:  Awake, oriented no focal def  HEENT:  NCAT Cardiovascular:  rrr w/out MRG Lungs:  Clear and w/out accessory muscle use  Abdomen:  Soft, not tender  Musculoskeletal:  Equal st and bulk Skin:  Warm, dry and intact   Recent Labs Lab 08/15/15 1440  NA 140  K 4.6  CL 105  CO2 26  BUN 16  CREATININE 1.11  GLUCOSE 134*    Recent Labs Lab 08/15/15 1440 08/16/15 0226  HGB 16.0 14.8  HCT 45.6 43.2  WBC 8.1 8.8  PLT 149* 153   Dg Chest 2 View  08/15/2015  CLINICAL DATA:  Shortness of breath. EXAM: CHEST  2 VIEW COMPARISON:  None. FINDINGS: The heart, hila, and mediastinum are normal. No pulmonary nodules, masses, or infiltrates. No pneumothorax. Blunting of the right costophrenic angle, only seen on the lateral view, suggesting tiny effusion. IMPRESSION: Probable tiny right pleural effusion with blunting of the costophrenic angle on the lateral view. No other suspicious findings. Electronically Signed   By: Dorise Bullion III M.D   On: 08/15/2015 14:59   Ct Angio Chest Pe W/cm &/or  Wo Cm  08/15/2015  CLINICAL DATA:  Shortness of breath and deep venous thrombosis EXAM: CT ANGIOGRAPHY CHEST WITH CONTRAST TECHNIQUE: Multidetector CT imaging of the chest was performed using the standard protocol during bolus administration of intravenous contrast. Multiplanar CT image reconstructions and MIPs were obtained to evaluate the vascular anatomy. CONTRAST:  154mL OMNIPAQUE IOHEXOL 350 MG/ML SOLN COMPARISON:  Chest radiograph August 15, 2015 FINDINGS: There is extensive pulmonary embolus arising in both distal main pulmonary arteries with extension into multiple upper and lower lobe branches bilaterally. The right ventricle to left ventricle diameter ratio is less than 0.9, not consistent with right heart strain. There is no appreciable thoracic aortic aneurysm or dissection. The visualized great vessels appear unremarkable. Pericardium is not thickened. There is left ventricular hypertrophy. There are foci of coronary artery calcification. There is mild bibasilar atelectatic change. There is no lung edema or consolidation. Visualized thyroid appears normal. There is no demonstrable thoracic adenopathy. There is reflux of contrast into the inferior vena cava, a finding suggesting increase in right heart pressure. Visualized upper abdominal structures appear unremarkable. There is degenerative change in the thoracic spine with diffuse idiopathic skeletal hyperostosis. No blastic or lytic bone lesions are identified. Review of the MIP images confirms the above  findings. IMPRESSION: Extensive pulmonary embolus bilaterally without demonstrable right heart strain. Mild bibasilar atelectasis. No lung edema or consolidation. No demonstrable adenopathy. Foci of coronary artery calcification present. There is reflux of contrast into the inferior vena cava, a finding suggesting increase in right heart pressure. Critical Value/emergent results were called by telephone at the time of interpretation on 08/15/2015 at  9:18 pm to Dr. Malvin Johns , who verbally acknowledged these results. Electronically Signed   By: Lowella Grip III M.D.   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 His Hct is 43.2 so seems unlikely this is directly r/t topical testosterone replacement. However he does have h/o skin cancer & it is thought testosterone replacement can raise risk of prostate CA. Think most importantly we need to exclude an underlying occult malignancy. He may indeed have an underlying hypercoagulable state but at this point further evaluation of this not indicated as this is his second VTE event.  Plan -f/u echo -send PSA -stopping his T supplementation is a consideration but we can NOT be assured that this would decrease his risk of VTE nor do we know that this was the provoking issue.  -transition to NOAC of choice - Encourage ambulation; have advised him from refraining from running for the next 4 weeks  - he is worried about the Penile injections for ED, asking about stopping medications prior. Advised that this would not be advised. Wonder if there are other topical agents for ED that do not involve needle injections (would defer this to his PCP).   Erick Colace ACNP-BC [place] Pager # [phone] OR # [phone] if no answer  08/16/2015, 8:07 AM

## 2015-08-16 NOTE — Discharge Summary (Signed)
Physician Discharge Summary  [name] POL:[id number] DOB: [birth year] DOA: 08/15/2015  PCP: [name], MD  Admit date: 08/15/2015 Discharge date: 08/16/2015  Time spent: 35 minutes  Recommendations for Outpatient Follow-up:  1. Patient recommended to discontinue testosterone supplementation 2. Patient will need lifelong Xarelto with starter pack-patient will need follow-up with primary care physician regarding further refills and authorizations 3. Recommend CBC plus differential as an outpatient 4. Recommend discussion as outpatient with endocrinology regarding resumption of testosterone if patient really wishes to be back on this 5. Please follow-up PSA as an outpatient and consider outpatient screening for malignancy based on age appropriate guidelines per [place] PTF  Discharge Diagnoses:  Active Problems:   Pulmonary emboli (HCC)   Pulmonary embolism ([place])   Discharge Condition: Fair  Diet recommendation: Heart healthy  Filed Weights   08/15/15 1433 08/15/15 2314  Weight: 87.544 kg (193 lb) 88.27 kg (194 lb 9.6 oz)    History of present illness:  70 year old fit Caucasian male Prior bilateral venous thromboembolism in the past after a trip from [place] to [place], admitted with shortness of breath, right leg swelling and increasing shortness of breath Lotion to Doppler = right lower extremity clot CT angiogram = bilateral thrombus distal pulmonary arteries Patient was started on heparin GTT Echocardiogram did not show any right ventricular dysfunction It was recommended patient desist from jogging as well as strenuous activity for about 1 month It was felt controversial the link between test*as well as PE and patient was discontinued off this and counseled not to use Patient met criteria  for Xarelto 15 twice a day for 21 days and then 20 mg daily subsequently lifelong and can have this monitored as an outpatient   Discharge Exam: Filed Vitals:    08/16/15 1012 08/16/15 1312  BP: 135/65 129/58  Pulse: 64 59  Temp: 98.2 F (36.8 C) 98.4 F (36.9 C)  Resp: 118 20    Genera alert pleasant oriented no apparent distress F2 Cardiac S1-S2 no murmur rub or gallop no ovascular:  Respiratory:  Clinically clear no added sound  Discharge Instructions   Discharge Instructions    Diet - low sodium heart healthy    Complete by:  As directed      Discharge instructions    Complete by:  As directed   Please take Xarelto as directed Please follow up with your primary Md for blood work as OP     Increase activity slowly    Complete by:  As directed           Current Discharge Medication List    START taking these medications   Details  Rivaroxaban (XARELTO STARTER PACK) 15 & 20 MG TBPK Take as directed on package: Start with one 14m tablet by mouth twice a day with food. On Day 22, switch to one 272mtablet once a day with food. Qty: 51 each, Refills: 0      CONTINUE these medications which have NOT CHANGED   Details  minocycline (MINOCIN,DYNACIN) 100 MG capsule Take 100 mg by mouth 2 (two) times a week.     Multiple Vitamin (MULTIVITAMIN WITH MINERALS) TABS tablet Take 1 tablet by mouth daily.    simvastatin (ZOCOR) 80 MG tablet Take 80 mg by mouth at bedtime.    valACYclovir (VALTREX) 1000 MG tablet TAKE 1 TABLET (1,000 MG TOTAL) BY MOUTH DAILY. Qty: 90 tablet, Refills: 3   Associated Diagnoses: HSV-2 seropositive    vardenafil (LEVITRA) 20 MG tablet Take 1  tablet (20 mg total) by mouth daily as needed for erectile dysfunction. Qty: 10 tablet, Refills: 12      STOP taking these medications     Testosterone 10 MG/ACT (2%) GEL        No Known Allergies Follow-up Information    Follow up with Primary Care Doctor.   Contact information:   Please provide below information to primary doctor for required prior authorization for Xarelto:        The results of significant diagnostics from this hospitalization  (including imaging, microbiology, ancillary and laboratory) are listed below for reference.    Significant Diagnostic Studies: Dg Chest 2 View  08/15/2015  CLINICAL DATA:  Shortness of breath. EXAM: CHEST  2 VIEW COMPARISON:  None. FINDINGS: The heart, hila, and mediastinum are normal. No pulmonary nodules, masses, or infiltrates. No pneumothorax. Blunting of the right costophrenic angle, only seen on the lateral view, suggesting tiny effusion. IMPRESSION: Probable tiny right pleural effusion with blunting of the costophrenic angle on the lateral view. No other suspicious findings. Electronically Signed   By: Dorise Bullion III M.D   On: 08/15/2015 14:59   Ct Angio Chest Pe W/cm &/or Wo Cm  08/15/2015  CLINICAL DATA:  Shortness of breath and deep venous thrombosis EXAM: CT ANGIOGRAPHY CHEST WITH CONTRAST TECHNIQUE: Multidetector CT imaging of the chest was performed using the standard protocol during bolus administration of intravenous contrast. Multiplanar CT image reconstructions and MIPs were obtained to evaluate the vascular anatomy. CONTRAST:  122m OMNIPAQUE IOHEXOL 350 MG/ML SOLN COMPARISON:  Chest radiograph August 15, 2015 FINDINGS: There is extensive pulmonary embolus arising in both distal main pulmonary arteries with extension into multiple upper and lower lobe branches bilaterally. The right ventricle to left ventricle diameter ratio is less than 0.9, not consistent with right heart strain. There is no appreciable thoracic aortic aneurysm or dissection. The visualized great vessels appear unremarkable. Pericardium is not thickened. There is left ventricular hypertrophy. There are foci of coronary artery calcification. There is mild bibasilar atelectatic change. There is no lung edema or consolidation. Visualized thyroid appears normal. There is no demonstrable thoracic adenopathy. There is reflux of contrast into the inferior vena cava, a finding suggesting increase in right heart pressure.  Visualized upper abdominal structures appear unremarkable. There is degenerative change in the thoracic spine with diffuse idiopathic skeletal hyperostosis. No blastic or lytic bone lesions are identified. Review of the MIP images confirms the above findings. IMPRESSION: Extensive pulmonary embolus bilaterally without demonstrable right heart strain. Mild bibasilar atelectasis. No lung edema or consolidation. No demonstrable adenopathy. Foci of coronary artery calcification present. There is reflux of contrast into the inferior vena cava, a finding suggesting increase in right heart pressure. Critical Value/emergent results were called by telephone at the time of interpretation on 08/15/2015 at 9:18 pm to Dr. MMalvin Johns, who verbally acknowledged these results. Electronically Signed   By: WLowella GripIII M.D.   On: 08/15/2015 21:20    Microbiology: No results found for this or any previous visit (from the past 240 hour(s)).   Labs: Basic Metabolic Panel:  Recent Labs Lab 08/15/15 1440  NA 140  K 4.6  CL 105  CO2 26  GLUCOSE 134*  BUN 16  CREATININE 1.11  CALCIUM 9.7   Liver Function Tests: No results for input(s): AST, ALT, ALKPHOS, BILITOT, PROT, ALBUMIN in the last 168 hours. No results for input(s): LIPASE, AMYLASE in the last 168 hours. No results for input(s): AMMONIA in the  last 168 hours. CBC:  Recent Labs Lab 08/15/15 1440 08/16/15 0226  WBC 8.1 8.8  HGB 16.0 14.8  HCT 45.6 43.2  MCV 98.5 98.4  PLT 149* 153   Cardiac Enzymes: No results for input(s): CKTOTAL, CKMB, CKMBINDEX, TROPONINI in the last 168 hours. BNP: BNP (last 3 results) No results for input(s): BNP in the last 8760 hours.  ProBNP (last 3 results) No results for input(s): PROBNP in the last 8760 hours.  CBG: No results for input(s): GLUCAP in the last 168 hours.     SignedNita Sells MD   [facility] 08/16/2015, 2:03 PM

## 2015-08-16 NOTE — Progress Notes (Signed)
Pt discharge education and instructions completed with pt at bedside; pt voices understanding and denies any questions. Pt IV and telemetry removed. Pt discharge home with spouse to transport him home. Pt handed his prescription for xarelto. Pt transported off unit via wheelchair with belongings to the side. Francis Gaines Fuquan Wilson RN.

## 2016-02-01 ENCOUNTER — Other Ambulatory Visit: Payer: Self-pay | Admitting: Family Medicine

## 2016-03-08 ENCOUNTER — Other Ambulatory Visit: Payer: Self-pay | Admitting: Physician Assistant

## 2016-03-11 ENCOUNTER — Other Ambulatory Visit: Payer: Self-pay | Admitting: Physician Assistant

## 2016-04-05 ENCOUNTER — Ambulatory Visit (INDEPENDENT_AMBULATORY_CARE_PROVIDER_SITE_OTHER): Payer: Medicare Other | Admitting: Family Medicine

## 2016-04-05 ENCOUNTER — Encounter: Payer: Self-pay | Admitting: Family Medicine

## 2016-04-05 VITALS — BP 152/82 | HR 50 | Temp 97.5°F | Resp 18 | Ht 72.5 in | Wt 201.4 lb

## 2016-04-05 DIAGNOSIS — Z113 Encounter for screening for infections with a predominantly sexual mode of transmission: Secondary | ICD-10-CM | POA: Diagnosis not present

## 2016-04-05 DIAGNOSIS — R21 Rash and other nonspecific skin eruption: Secondary | ICD-10-CM

## 2016-04-05 NOTE — Patient Instructions (Addendum)
  I will check sexually transmitted infection tests, but the bump may just be a hair follicle that is swollen. If that bump persists for the next 2 weeks, or is growing during that time, we can look at other causes. If you do follow up in the next 2 weeks, clarify the previous diagnosis from your dermatologist as it may be a similar lesion.   IF you received an x-ray today, you will receive an invoice from [facility]. Please contact [facility] at [phone] with questions or concerns regarding your invoice.   IF you received labwork today, you will receive an invoice from [employer]. Please contact [facility] at [phone] with questions or concerns regarding your invoice.   Our billing staff will not be able to assist you with questions regarding bills from these companies.  You will be contacted with the lab results as soon as they are available. The fastest way to get your results is to activate your My Chart account. Instructions are located on the last page of this paperwork. If you have not heard from [place] regarding the results in 2 weeks, please contact this office.

## 2016-04-05 NOTE — Progress Notes (Signed)
Subjective:  By signing my name below, I, [name], attest that this documentation has been prepared under the direction and in the presence of Justin Ray, MD. Electronically Signed: [name], [place]. 04/05/2016 , 4:31 PM .  Patient was seen in Room 3 .   Patient ID: [name], male    DOB: [birth year], 70 y.o.   MRN: [id number] Chief Complaint  Patient presents with  . spot on genitals    x no morer than 1 week  . std screening   HPI [name] is a 70 y.o. male Patient noticed a bump right below his penis after having unprotected intercourse a week ago. He informs seeing the bump about 2~3 days after. He has a slight itch in the area. He has history of genital herpes and last outbreak was a long time ago. He takes valacyclovir to prevent it. He denies STD's in the past. He was last checked by Dr. Ouida Sills in April 2015. He denies shaving the genitalia area. He denies dysuria or penile discharge. He informs only vaginal intercourse with women.   He wants to have STD screening done just in case. He mentions being seen by dermatologist prior.   His PCP is Justin Austria, MD. 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 He is also on Fortesta (testosterone booster).   Patient Active Problem List   Diagnosis Date Noted  . Pulmonary emboli ([place]) 08/15/2015  . Pulmonary embolism ([place]) 08/15/2015   Past Medical History:  Diagnosis Date  . Hyperlipidemia   . Low testosterone   . Prediabetes    "prediabetic; lost weight; increased activity; now Hickman sugar is fine" (08/16/2015  . Pulmonary embolism ([place]) 08/15/2015   "bilateral large clots"  . Rosacea   . Skin cancer    "left groin area"  . Walking pneumonia 1990s X 1   Past Surgical History:  Procedure Laterality Date  . APPENDECTOMY  1967  . CHOLECYSTECTOMY OPEN  ~ 2006  . ORIF TIBIA & FIBULA FRACTURES Right 2005   "has a rod in it now"  . TONSILLECTOMY  1950s    No Known Allergies Prior to Admission medications   Medication Sig Start Date End Date Taking? Authorizing Provider  minocycline (MINOCIN,DYNACIN) 100 MG capsule Take 100 mg by mouth 2 (two) times a week.     Historical Provider, MD  Multiple Vitamin (MULTIVITAMIN WITH MINERALS) TABS tablet Take 1 tablet by mouth daily.    Historical Provider, MD  Rivaroxaban (XARELTO STARTER PACK) 15 & 20 MG TBPK Take as directed on package: Start with one 15mg  tablet by mouth twice a day with food. On Day 22, switch to one 20mg  tablet once a day with food. 08/16/15   Justin Sells, MD  simvastatin (ZOCOR) 80 MG tablet Take 80 mg by mouth at bedtime. 06/18/15   Historical Provider, MD  valACYclovir (VALTREX) 1000 MG tablet TAKE 1 TABLET (1,000 MG TOTAL) BY MOUTH DAILY. 02/02/16   Justin Bale, PA-C  vardenafil (LEVITRA) 20 MG tablet Take 1 tablet (20 mg total) by mouth daily as needed for erectile dysfunction. 04/04/12 11/03/13  Justin Culver, MD   Social History   Social History  . Marital status: Married    Spouse name: N/A  . Number of children: N/A  . Years of education: N/A   Occupational History  . Not on file.   Social History Main Topics  . Smoking status: Former Smoker  Years: 10.00    Types: Cigarettes  . Smokeless tobacco: Never Used     Comment: 08/16/2015 "smoked very infrequent; smoked the most in my teens; none for 2-3 years"  . Alcohol use 1.2 oz/week    2 Shots of liquor per week     Comment: "I like moonshine or whiskey"  . Drug use:     Types: Marijuana     Comment: 08/16/2015 "I have smoked weed a a couple times"  . Sexual activity: Yes   Other Topics Concern  . Not on file   Social History Narrative  . No narrative on file   Review of Systems  Constitutional: Negative for chills, diaphoresis, fatigue and fever.  Gastrointestinal: Negative for abdominal pain, diarrhea, nausea and vomiting.  Genitourinary: Positive for genital sores. Negative for discharge,  dysuria, hematuria and urgency.       Objective:   Physical Exam  Constitutional: He is oriented to person, place, and time. He appears well-developed and well-nourished. No distress.  HENT:  Head: Normocephalic and atraumatic.  Eyes: EOM are normal. Pupils are equal, round, and reactive to light.  Neck: Neck supple.  Cardiovascular: Normal rate.   Pulmonary/Chest: Effort normal. No respiratory distress.  Genitourinary:  Genitourinary Comments: 1 small papule at the base of hte penis, midline, no vesicles or discharge, no other rash, no penile discharge   Musculoskeletal: Normal range of motion.  Neurological: He is alert and oriented to person, place, and time.  Skin: Skin is warm and dry.  Psychiatric: He has a normal mood and affect. His behavior is normal.  Nursing note and vitals reviewed.   Vitals:   04/05/16 1543  BP: (!) 152/82  Pulse: (!) 50  Resp: 18  Temp: 97.5 F (36.4 C)  TempSrc: Oral  SpO2: 98%  Weight: 201 lb 6.4 oz (91.4 kg)  Height: 6' 0.5" (1.842 m)      Assessment & Plan:   [name] is a 70 y.o. male Rash of genital area  Screen for STD (sexually transmitted disease) - Plan: GC/Chlamydia Probe Amp, HIV antibody, RPR  - History of unprotected intercourse, will check for STI's, and safer sex practices discussed.   -Small papule noted at superior genital area, does not appear to be typical for HSV, possible infected/blocked hair follicle, or early HPV. No acute treatment today, advised to follow-up in few weeks if the area is still present or growing. At that time, would want to know what his dermatologist treated him for prior.   Meds ordered this encounter  Medications  . sildenafil (VIAGRA) 100 MG tablet    Sig: Take 100 mg by mouth daily as needed for erectile dysfunction.   Patient Instructions     I will check sexually transmitted infection tests, but the bump may just be a hair follicle that is swollen. If that bump persists for the  next 2 weeks, or is growing during that time, we can look at other causes. If you do follow up in the next 2 weeks, clarify the previous diagnosis from your dermatologist as it may be a similar lesion.   IF you received an x-Hickman today, you will receive an invoice from [facility]. Please contact [facility] at [phone] with questions or concerns regarding your invoice.   IF you received labwork today, you will receive an invoice from [employer]. Please contact [facility] at [phone] with questions or concerns regarding your invoice.   Our billing staff will not be  able to assist you with questions regarding bills from these companies.  You will be contacted with the lab results as soon as they are available. The fastest way to get your results is to activate your My Chart account. Instructions are located on the last page of this paperwork. If you have not heard from [place] regarding the results in 2 weeks, please contact this office.        I personally performed the services described in this documentation, which was scribed in my presence. The recorded information has been reviewed and considered, and addended by me as needed.   Signed,   Justin Ray, MD [facility].  04/07/16 1:07 PM

## 2016-04-06 LAB — GC/CHLAMYDIA PROBE AMP
CT Probe RNA: NOT DETECTED
GC PROBE AMP APTIMA: NOT DETECTED

## 2016-04-06 LAB — HIV ANTIBODY (ROUTINE TESTING W REFLEX): HIV 1&2 Ab, 4th Generation: NONREACTIVE

## 2016-04-06 LAB — RPR TITER

## 2016-04-06 LAB — RPR: RPR Ser Ql: REACTIVE — AB

## 2016-04-08 LAB — FLUORESCENT TREPONEMAL AB(FTA)-IGG-BLD: Fluorescent Treponemal ABS: REACTIVE — AB

## 2016-05-01 ENCOUNTER — Encounter: Payer: Self-pay | Admitting: Family Medicine

## 2016-05-01 DIAGNOSIS — A530 Latent syphilis, unspecified as early or late: Secondary | ICD-10-CM | POA: Insufficient documentation

## 2016-05-04 ENCOUNTER — Ambulatory Visit (INDEPENDENT_AMBULATORY_CARE_PROVIDER_SITE_OTHER): Payer: Medicare Other | Admitting: Physician Assistant

## 2016-05-04 DIAGNOSIS — A539 Syphilis, unspecified: Secondary | ICD-10-CM | POA: Diagnosis not present

## 2016-05-04 MED ORDER — PENICILLIN G BENZATHINE 1200000 UNIT/2ML IM SUSP
2.4000 10*6.[IU] | Freq: Once | INTRAMUSCULAR | Status: AC
  Administered 2016-05-04: 2.4 10*6.[IU] via INTRAMUSCULAR

## 2016-05-04 MED ORDER — PENICILLIN G BENZATHINE 1200000 UNIT/2ML IM SUSP
2.4000 10*6.[IU] | INTRAMUSCULAR | Status: AC
  Administered 2016-05-21: 2.4 10*6.[IU] via INTRAMUSCULAR

## 2016-05-04 NOTE — Patient Instructions (Signed)
Return to clinic in 1 week for your next injections     Syphilis Syphilis is an infectious disease. It can cause serious complications if left untreated.  CAUSES  Syphilis is caused by a type of bacteria called Treponema pallidum. It is most commonly spread through sexual contact. Syphilis may also spread to a fetus through the blood of the mother.  SIGNS AND SYMPTOMS Symptoms vary depending on the stage of the disease. Some symptoms may disappear without treatment. However, this does not mean that the infection is gone. One form of syphilis (called latent syphilis) has no symptoms.  Primary Syphilis  Painless sores (chancres) in and around the genital organs and mouth.  Swollen lymph nodes near the sores. Secondary Syphilis  A rash or sores over any portion of the body, including the palms of the hands and soles of the feet.  Fever.  Headache.  Sore throat.  Swollen lymph nodes.  New sores in the mouth or on the genitals.  Feeling generally ill.  Having pain in the joints. Tertiary Syphilis The third stage of syphilis involves severe damage to different organs in the body, such as the brain, spinal cord, and heart. Signs and symptoms may include:   Dementia.  Personality and mood changes.  Difficulty walking.  Heart failure.  Fainting.  Enlargement (aneurysm) of the aorta.  Tumors of the skin, bones, or liver.  Muscle weakness.  Sudden "lightning" pains, numbness, or tingling.  Problems with coordination.  Vision changes. DIAGNOSIS   A physical exam will be done.  Blood tests will be done to confirm the diagnosis.  If the disease is in the first or second stages, a fluid (drainage) sample from a sore or rash may be examined under a microscope to detect the disease-causing bacteria.  Fluid around the spine may need to be examined to detect brain damage or inflammation of the brain lining (meningitis).  If the disease is in the third stage, X-rays,  CT scans, MRIs, echocardiograms, ultrasounds, or cardiac catheterization may also be done to detect disease of the heart, aorta, or brain. TREATMENT  Syphilis can be cured with antibiotic medicine if a diagnosis is made early. During the first day of treatment, you may experience fever, chills, headache, nausea, or aching all over your body. This is a normal reaction to the antibiotics.  HOME CARE INSTRUCTIONS   Take your antibiotic medicine as directed by your health care provider. Finish the antibiotic even if you start to feel better. Incomplete treatment will put you at risk for continued infection and could be life threatening.  Take medicines only as directed by your health care provider.  Do not have sexual intercourse until your treatment is completed or as directed by your health care provider.  Inform your recent sexual partners that you were diagnosed with syphilis. They need to seek care and treatment, even if they have no symptoms. It is necessary that all your sexual partners be tested for infection and treated if they have the disease.  Keep all follow-up visits as directed by your health care provider. It is important to keep all your appointments.  If your test results are not ready during your visit, make an appointment with your health care provider to find out the results. Do not assume everything is normal if you have not heard from your health care provider or the medical facility. It is your responsibility to get your test results. SEEK MEDICAL CARE IF:  You continue to have any of  the following 24 hours after beginning treatment:  Fever.  Chills.  Headache.  Nausea.  Aching all over your body.  You have symptoms of an allergic reaction to medicine, such as:  Chills.  A headache.  Light-headedness.  A new rash (especially hives).  Difficulty breathing. MAKE SURE YOU:   Understand these instructions.  Will watch your condition.  Will get help right  away if you are not doing well or get worse.   This information is not intended to replace advice given to you by your health care provider. Make sure you discuss any questions you have with your health care provider.   Document Released: 05/05/2013 Document Revised: 08/05/2014 Document Reviewed: 05/05/2013 Elsevier Interactive Patient Education [employer].

## 2016-05-04 NOTE — Progress Notes (Signed)
Pt is here for an injection.

## 2016-05-07 ENCOUNTER — Telehealth: Payer: Self-pay | Admitting: Emergency Medicine

## 2016-05-07 NOTE — Telephone Encounter (Signed)
Left a detailed message to call Truman Hayward, [place] back regarding recent positive Syphilis diagnosis.  Pt is scheduled to return next Saturday for 2nd injection treatment.

## 2016-05-09 ENCOUNTER — Other Ambulatory Visit: Payer: Self-pay | Admitting: Family Medicine

## 2016-05-09 DIAGNOSIS — Z136 Encounter for screening for cardiovascular disorders: Secondary | ICD-10-CM

## 2016-05-10 ENCOUNTER — Ambulatory Visit (INDEPENDENT_AMBULATORY_CARE_PROVIDER_SITE_OTHER): Payer: Medicare Other | Admitting: Urgent Care

## 2016-05-10 DIAGNOSIS — A539 Syphilis, unspecified: Secondary | ICD-10-CM | POA: Diagnosis not present

## 2016-05-10 NOTE — Progress Notes (Signed)
Patient was here for a penicillin injection only. Patient received injection. Patient tolerated injection well.

## 2016-05-21 ENCOUNTER — Ambulatory Visit (INDEPENDENT_AMBULATORY_CARE_PROVIDER_SITE_OTHER): Payer: Medicare Other | Admitting: Physician Assistant

## 2016-05-21 DIAGNOSIS — A539 Syphilis, unspecified: Secondary | ICD-10-CM | POA: Diagnosis not present

## 2016-05-27 ENCOUNTER — Ambulatory Visit
Admission: RE | Admit: 2016-05-27 | Discharge: 2016-05-27 | Disposition: A | Discharge status: Home / Self Care | Payer: Medicare Other | Source: Ambulatory Visit | Attending: Family Medicine | Admitting: Family Medicine

## 2016-05-27 DIAGNOSIS — Z136 Encounter for screening for cardiovascular disorders: Secondary | ICD-10-CM

## 2016-10-05 ENCOUNTER — Ambulatory Visit (INDEPENDENT_AMBULATORY_CARE_PROVIDER_SITE_OTHER): Payer: Medicare Other | Admitting: Family Medicine

## 2016-10-05 VITALS — BP 138/62 | HR 70 | Temp 97.3°F | Ht 72.5 in | Wt 213.8 lb

## 2016-10-05 DIAGNOSIS — Z202 Contact with and (suspected) exposure to infections with a predominantly sexual mode of transmission: Secondary | ICD-10-CM

## 2016-10-05 DIAGNOSIS — A539 Syphilis, unspecified: Secondary | ICD-10-CM

## 2016-10-05 NOTE — Patient Instructions (Addendum)
IF you received an x-ray today, you will receive an invoice from [facility]. Please contact [facility] at [phone] with questions or concerns regarding your invoice.   IF you received labwork today, you will receive an invoice from [facility]. Please contact [facility] at [phone] with questions or concerns regarding your invoice.   Our billing staff will not be able to assist you with questions regarding bills from these companies.  You will be contacted with the lab results as soon as they are available. The fastest way to get your results is to activate your My Chart account. Instructions are located on the last page of this paperwork. If you have not heard from [place] regarding the results in 2 weeks, please contact this office.     Sexually Transmitted Disease A sexually transmitted disease (STD) is a disease or infection that may be passed (transmitted) from person to person, usually during sexual activity. This may happen by way of saliva, semen, blood, vaginal mucus, or urine. Common STDs include:  Gonorrhea.  Chlamydia.  Syphilis.  HIV and AIDS.  Genital herpes.  Hepatitis B and C.  Trichomonas.  Human papillomavirus (HPV).  Pubic lice.  Scabies.  Mites.  Bacterial vaginosis. What are the causes? An STD may be caused by bacteria, a virus, or parasites. STDs are often transmitted during sexual activity if one person is infected. However, they may also be transmitted through nonsexual means. STDs may be transmitted after:  Sexual intercourse with an infected person.  Sharing sex toys with an infected person.  Sharing needles with an infected person or using unclean piercing or tattoo needles.  Having intimate contact with the genitals, mouth, or rectal areas of an infected person.  Exposure to infected fluids during birth. What are the signs or symptoms? Different STDs have different symptoms. Some people may not have any symptoms.  If symptoms are present, they may include:  Painful or bloody urination.  Pain in the pelvis, abdomen, vagina, anus, throat, or eyes.  A skin rash, itching, or irritation.  Growths, ulcerations, blisters, or sores in the genital and anal areas.  Abnormal vaginal discharge with or without bad odor.  Penile discharge in men.  Fever.  Pain or bleeding during sexual intercourse.  Swollen glands in the groin area.  Yellow skin and eyes (jaundice). This is seen with hepatitis.  Swollen testicles.  Infertility.  Sores and blisters in the mouth. How is this diagnosed? To make a diagnosis, your health care provider may:  Take a medical history.  Perform a physical exam.  Take a sample of any discharge to examine.  Swab the throat, cervix, opening to the penis, rectum, or vagina for testing.  Test a sample of your first morning urine.  Perform blood tests.  Perform a Pap test, if this applies.  Perform a colposcopy.  Perform a laparoscopy. How is this treated? Treatment depends on the STD. Some STDs may be treated but not cured.  Chlamydia, gonorrhea, trichomonas, and syphilis can be cured with antibiotic medicine.  Genital herpes, hepatitis, and HIV can be treated, but not cured, with prescribed medicines. The medicines lessen symptoms.  Genital warts from HPV can be treated with medicine or by freezing, burning (electrocautery), or surgery. Warts may come back.  HPV cannot be cured with medicine or surgery. However, abnormal areas may be removed from the cervix, vagina, or vulva.  If your diagnosis is confirmed, your recent sexual partners need treatment. This is true even if they  are symptom-free or have a negative culture or evaluation. They should not have sex until their health care providers say it is okay.  Your health care provider may test you for infection again 3 months after treatment. How is this prevented? Take these steps to reduce your risk of  getting an STD:  Use latex condoms, dental dams, and water-soluble lubricants during sexual activity. Do not use petroleum jelly or oils.  Avoid having multiple sex partners.  Do not have sex with someone who has other sex partners.  Do not have sex with anyone you do not know or who is at high risk for an STD.  Avoid risky sex practices that can break your skin.  Do not have sex if you have open sores on your mouth or skin.  Avoid drinking too much alcohol or taking illegal drugs. Alcohol and drugs can affect your judgment and put you in a vulnerable position.  Avoid engaging in oral and anal sex acts.  Get vaccinated for HPV and hepatitis. If you have not received these vaccines in the past, talk to your health care provider about whether one or both might be right for you.  If you are at risk of being infected with HIV, it is recommended that you take a prescription medicine daily to prevent HIV infection. This is called pre-exposure prophylaxis (PrEP). You are considered at risk if:  You are a man who has sex with other men (MSM).  You are a heterosexual man or woman and are sexually active with more than one partner.  You take drugs by injection.  You are sexually active with a partner who has HIV.  Talk with your health care provider about whether you are at high risk of being infected with HIV. If you choose to begin PrEP, you should first be tested for HIV. You should then be tested every 3 months for as long as you are taking PrEP. Contact a health care provider if:  See your health care provider.  Tell your sexual partner(s). They should be tested and treated for any STDs.  Do not have sex until your health care provider says it is okay. Get help right away if: Contact your health care provider right away if:  You have severe abdominal pain.  You are a man and notice swelling or pain in your testicles.  You are a woman and notice swelling or pain in your  vagina. This information is not intended to replace advice given to you by your health care provider. Make sure you discuss any questions you have with your health care provider. Document Released: 10/05/2002 Document Revised: 02/02/2016 Document Reviewed: 02/02/2013 Elsevier Interactive Patient Education  2017 [employer].

## 2016-10-05 NOTE — Progress Notes (Signed)
Chief Complaint  Patient presents with  . STD Testing    HPI   Pt is here for repeat std testing for syphilis infection He reports that his partners have been notified and treated for RPR back in September 2017 He states that he was sexually active with more than one partner and back then was not using condoms He denies rash, penile discharge or burning pain.  Past Medical History:  Diagnosis Date  . Hyperlipidemia   . Low testosterone   . Prediabetes    "prediabetic; lost weight; increased activity; now blood sugar is fine" (08/16/2015  . Pulmonary embolism ([place]) 08/15/2015   "bilateral large clots"  . Rosacea   . Skin cancer    "left groin area"  . Walking pneumonia 1990s X 1    Current Outpatient Prescriptions  Medication Sig Dispense Refill  . minocycline (MINOCIN,DYNACIN) 100 MG capsule Take 100 mg by mouth 2 (two) times a week.     . Multiple Vitamin (MULTIVITAMIN WITH MINERALS) TABS tablet Take 1 tablet by mouth daily.    . Rivaroxaban (XARELTO STARTER PACK) 15 & 20 MG TBPK Take as directed on package: Start with one 15mg  tablet by mouth twice a day with food. On Day 22, switch to one 20mg  tablet once a day with food. 51 each 0  . sertraline (ZOLOFT) 25 MG tablet Take 25 mg by mouth daily.    . sildenafil (REVATIO) 20 MG tablet Take 20 mg by mouth as needed.    . simvastatin (ZOCOR) 80 MG tablet Take 80 mg by mouth at bedtime.    . valACYclovir (VALTREX) 1000 MG tablet TAKE 1 TABLET (1,000 MG TOTAL) BY MOUTH DAILY. (Patient not taking: Reported on 10/05/2016) 30 tablet 0  . vardenafil (LEVITRA) 20 MG tablet Take 1 tablet (20 mg total) by mouth daily as needed for erectile dysfunction. 10 tablet 12   No current facility-administered medications for this visit.     Allergies: No Known Allergies  Past Surgical History:  Procedure Laterality Date  . APPENDECTOMY  1967  . CHOLECYSTECTOMY OPEN  ~ 2006  . ORIF TIBIA & FIBULA FRACTURES Right 2005   "has a rod in it now"    . TONSILLECTOMY  1950s    Social History   Social History  . Marital status: Married    Spouse name: N/A  . Number of children: N/A  . Years of education: N/A   Social History Main Topics  . Smoking status: Former Smoker    Years: 10.00    Types: Cigarettes  . Smokeless tobacco: Never Used     Comment: 08/16/2015 "smoked very infrequent; smoked the most in my teens; none for 2-3 years"  . Alcohol use 1.2 oz/week    2 Shots of liquor per week     Comment: "I like moonshine or whiskey"  . Drug use: Yes    Types: Marijuana     Comment: 08/16/2015 "I have smoked weed a a couple times"  . Sexual activity: Yes   Other Topics Concern  . None   Social History Narrative  . None    Review of Systems  Constitutional: Negative for chills and fever.  Skin: Negative for itching and rash.    Objective: Vitals:   10/05/16 1415  BP: 138/62  Pulse: 70  Temp: 97.3 F (36.3 C)  TempSrc: Oral  SpO2: 97%  Weight: 213 lb 12.8 oz (97 kg)  Height: 6' 0.5" (1.842 m)    Physical Exam General: alert,  oriented, in NAD Head: normocephalic, atraumatic, no sinus tenderness Heart: normal rate, normal sinus rhythm, no murmurs Lungs: clear to auscultation bilaterally, no wheezing   Assessment and Plan [place] was seen today for std testing.  Diagnoses and all orders for this visit:  Syphilis -     RPR  Possible exposure to STD -     HCV Ab w/Rflx to Verification -     Hepatitis B surface antigen -     RPR -     GC/Chlamydia Probe Amp -     HIV antibody  Will rescreen today for 6 months follow up   [place]

## 2016-10-08 LAB — GC/CHLAMYDIA PROBE AMP
CHLAMYDIA, DNA PROBE: NEGATIVE
Neisseria gonorrhoeae by PCR: NEGATIVE

## 2016-10-08 LAB — RPR, QUANT+TP ABS (REFLEX)
Rapid Plasma Reagin, Quant: 1:8 {titer} — ABNORMAL HIGH
TREPONEMA PALLIDUM AB: NEGATIVE

## 2016-10-08 LAB — HIV ANTIBODY (ROUTINE TESTING W REFLEX): HIV SCREEN 4TH GENERATION: NONREACTIVE

## 2016-10-08 LAB — HCV INTERPRETATION

## 2016-10-08 LAB — RPR: RPR Ser Ql: REACTIVE — AB

## 2016-10-08 LAB — HEPATITIS B SURFACE ANTIGEN: Hepatitis B Surface Ag: NEGATIVE

## 2016-10-08 LAB — HCV AB W/RFLX TO VERIFICATION: HCV Ab: 0.1 s/co ratio (ref 0.0–0.9)

## 2016-10-09 NOTE — Progress Notes (Signed)
Injection only visit

## 2016-10-10 ENCOUNTER — Telehealth: Payer: Self-pay | Admitting: Family Medicine

## 2016-10-10 NOTE — Telephone Encounter (Signed)
Unable to reach patient regarding positive Syphilis and continued titer that is unchanged. Sent a [employer]. Would like this patient to follow up for retreatment and re-examination. Please explain this to the patient. If he can come in tomorrow then he can be double booked with me.

## 2016-10-11 ENCOUNTER — Ambulatory Visit (INDEPENDENT_AMBULATORY_CARE_PROVIDER_SITE_OTHER): Payer: Medicare Other | Admitting: Family Medicine

## 2016-10-11 ENCOUNTER — Encounter: Payer: Self-pay | Admitting: Family Medicine

## 2016-10-11 VITALS — BP 130/62 | HR 65 | Temp 97.5°F | Resp 16 | Ht 72.5 in | Wt 214.0 lb

## 2016-10-11 DIAGNOSIS — A530 Latent syphilis, unspecified as early or late: Secondary | ICD-10-CM | POA: Insufficient documentation

## 2016-10-11 DIAGNOSIS — A528 Late syphilis, latent: Secondary | ICD-10-CM | POA: Diagnosis not present

## 2016-10-11 MED ORDER — PENICILLIN G BENZATHINE 1200000 UNIT/2ML IM SUSP
2.4000 10*6.[IU] | INTRAMUSCULAR | Status: AC
  Administered 2016-10-11 – 2016-10-25 (×3): 2.4 10*6.[IU] via INTRAMUSCULAR

## 2016-10-11 NOTE — Telephone Encounter (Signed)
Patient has read the test results and Dr. Nolon Rod' message in My Chart. He has contacted the office and scheduled a visit with her for today.

## 2016-10-11 NOTE — Progress Notes (Signed)
Chief Complaint  Patient presents with  . Follow-up    Treatment for STI    HPI   Patient reports that he does not recall any previous infection with syphilis Reports that he does not have any lesions of his penis, no tremors, vision changes or confusion Reviewed medical records with patient Discussed with him that he has a history of positive RPR   Past Medical History:  Diagnosis Date  . Hyperlipidemia   . Low testosterone   . Prediabetes    "prediabetic; lost weight; increased activity; now blood sugar is fine" (08/16/2015  . Pulmonary embolism ([place]) 08/15/2015   "bilateral large clots"  . Rosacea   . Skin cancer    "left groin area"  . Walking pneumonia 1990s X 1    Current Outpatient Prescriptions  Medication Sig Dispense Refill  . minocycline (MINOCIN,DYNACIN) 100 MG capsule Take 100 mg by mouth 2 (two) times a week.     . Multiple Vitamin (MULTIVITAMIN WITH MINERALS) TABS tablet Take 1 tablet by mouth daily.    . Rivaroxaban (XARELTO STARTER PACK) 15 & 20 MG TBPK Take as directed on package: Start with one 15mg  tablet by mouth twice a day with food. On Day 22, switch to one 20mg  tablet once a day with food. 51 each 0  . sertraline (ZOLOFT) 25 MG tablet Take 25 mg by mouth daily.    . sildenafil (REVATIO) 20 MG tablet Take 20 mg by mouth as needed.    Marland Kitchen UNABLE TO FIND TRI MIX INJECTION    . valACYclovir (VALTREX) 1000 MG tablet TAKE 1 TABLET (1,000 MG TOTAL) BY MOUTH DAILY. (Patient taking differently: TAKE 500 MG BY MOUTH DAILY) 30 tablet 0  . simvastatin (ZOCOR) 80 MG tablet Take 80 mg by mouth at bedtime.    . vardenafil (LEVITRA) 20 MG tablet Take 1 tablet (20 mg total) by mouth daily as needed for erectile dysfunction. 10 tablet 12   Current Facility-Administered Medications  Medication Dose Route Frequency Provider Last Rate Last Dose  . penicillin g benzathine (BICILLIN LA) 1200000 UNIT/2ML injection 2.4 Million Units  2.4 Million Units Intramuscular Weekly Forrest Moron, MD   2.4 Million Units at 10/11/16 1207    Allergies: No Known Allergies  Past Surgical History:  Procedure Laterality Date  . APPENDECTOMY  1967  . CHOLECYSTECTOMY OPEN  ~ 2006  . ORIF TIBIA & FIBULA FRACTURES Right 2005   "has a rod in it now"  . TONSILLECTOMY  1950s    Social History   Social History  . Marital status: Married    Spouse name: N/A  . Number of children: N/A  . Years of education: N/A   Social History Main Topics  . Smoking status: Former Smoker    Years: 10.00    Types: Cigarettes  . Smokeless tobacco: Never Used     Comment: 08/16/2015 "smoked very infrequent; smoked the most in my teens; none for 2-3 years"  . Alcohol use 1.2 oz/week    2 Shots of liquor per week     Comment: "I like moonshine or whiskey"  . Drug use: Yes    Types: Marijuana     Comment: 08/16/2015 "I have smoked weed a a couple times"  . Sexual activity: Yes   Other Topics Concern  . None   Social History Narrative  . None    ROS See hpi  Objective: Vitals:   10/11/16 1136  BP: 130/62  Pulse: 65  Resp: 16  Temp: 97.5 F (36.4 C)  TempSrc: Oral  SpO2: 96%  Weight: 214 lb (97.1 kg)  Height: 6' 0.5" (1.842 m)    Physical Exam  Constitutional: He appears well-developed and well-nourished.  HENT:  Head: Normocephalic and atraumatic.  Pulmonary/Chest: Effort normal.  GU exam: no penile discharge, no rash at the base, shaft or glans of the penis, no inguinal LAD, no scrotal pain Skin: no rashes or lesions   Component     Latest Ref Rng & Units 10/05/2016  Rapid Plasma Reagin, Quant     NonRea<1:1 1:8 (H)  T Pallidum Abs     Negative Negative   Component     Latest Ref Rng & Units 10/25/2013 12/14/2014 04/05/2016  RPR Titer      1:8 1:16 (A) 1:8   Assessment and Plan Harman was seen today for follow-up.  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  -     penicillin g benzathine (BICILLIN LA) 1200000 UNIT/2ML injection 2.4 Million Units; Inject 4 mLs (2.4 Million Units total) into the muscle once a week.   A total of 25 minutes were spent face-to-face with the patient during this encounter and over half of that time was spent on counseling and coordination of care.   [place]

## 2016-10-11 NOTE — Patient Instructions (Addendum)
  Return to clinic every week for then next two weeks for treatment.      IF you received an x-ray today, you will receive an invoice from [facility]. Please contact [facility] at [phone] with questions or concerns regarding your invoice.   IF you received labwork today, you will receive an invoice from [place]. Please contact [facility] at [phone] with questions or concerns regarding your invoice.   Our billing staff will not be able to assist you with questions regarding bills from these companies.  You will be contacted with the lab results as soon as they are available. The fastest way to get your results is to activate your My Chart account. Instructions are located on the last page of this paperwork. If you have not heard from [place] regarding the results in 2 weeks, please contact this office.

## 2016-10-17 ENCOUNTER — Ambulatory Visit (INDEPENDENT_AMBULATORY_CARE_PROVIDER_SITE_OTHER): Payer: Medicare Other

## 2016-10-17 DIAGNOSIS — A528 Late syphilis, latent: Secondary | ICD-10-CM | POA: Diagnosis not present

## 2016-10-18 ENCOUNTER — Ambulatory Visit: Payer: Medicare Other

## 2016-10-25 ENCOUNTER — Ambulatory Visit (INDEPENDENT_AMBULATORY_CARE_PROVIDER_SITE_OTHER): Payer: Medicare Other | Admitting: Family Medicine

## 2016-10-25 DIAGNOSIS — A528 Late syphilis, latent: Secondary | ICD-10-CM

## 2016-10-25 DIAGNOSIS — A539 Syphilis, unspecified: Secondary | ICD-10-CM

## 2016-10-25 NOTE — Progress Notes (Signed)
Patient is here for his Bicillin injection.   He received the injection in his right upper outer quadrant.  Patient tolerated shot well was released without any issues or indications.

## 2016-11-26 ENCOUNTER — Encounter: Payer: Self-pay | Admitting: Urgent Care

## 2016-11-26 ENCOUNTER — Ambulatory Visit (INDEPENDENT_AMBULATORY_CARE_PROVIDER_SITE_OTHER): Payer: Medicare Other | Admitting: Urgent Care

## 2016-11-26 VITALS — BP 122/71 | HR 77 | Temp 98.0°F | Resp 16 | Ht 73.0 in | Wt 208.2 lb

## 2016-11-26 DIAGNOSIS — Z7251 High risk heterosexual behavior: Secondary | ICD-10-CM | POA: Diagnosis not present

## 2016-11-26 DIAGNOSIS — Z8619 Personal history of other infectious and parasitic diseases: Secondary | ICD-10-CM

## 2016-11-26 MED ORDER — PENICILLIN G BENZATHINE 1200000 UNIT/2ML IM SUSP
2.4000 10*6.[IU] | Freq: Once | INTRAMUSCULAR | Status: AC
  Administered 2016-11-26: 2.4 10*6.[IU] via INTRAMUSCULAR

## 2016-11-26 NOTE — Progress Notes (Signed)
  MRN: [id number] DOB: 11/16/1945  Subjective:   [name] is a 71 y.o. male presenting for chief complaint of Exposure to STD  Reports having had unprotected sex last week and would like to be checked again for STIs. He was last checked for syphilis in 09/2016. His titers were elevated and Dr. Nolon Rod treated patient for 3 weeks. He was supposed to have his titer checked again in 12/2016. Denies fever, dysuria, hematuria, urinary frequency, penile discharge, penile swelling, testicular pain, testicular swelling, anal pain, groin pain., rashes.   Lavarius has a current medication list which includes the following prescription(s): minocycline, multivitamin with minerals, rivaroxaban, sertraline, sildenafil, simvastatin, UNABLE TO FIND, valacyclovir, and vardenafil. Also has No Known Allergies.  Eaden  has a past medical history of Hyperlipidemia; Low testosterone; Prediabetes; Pulmonary embolism ([place]) (08/15/2015); Rosacea; Skin cancer; and Walking pneumonia (1990s X 1). Also  has a past surgical history that includes Appendectomy (9449); Tonsillectomy (1950s); Cholecystectomy open (~ 2006); and ORIF tibia & fibula fractures (Right, 2005).  Objective:   Vitals: BP 122/71 (BP Location: Right Arm, Patient Position: Sitting, Cuff Size: Normal)   Pulse 77   Temp 98 F (36.7 C) (Oral)   Resp 16   Ht 6\' 1"  (1.854 m)   Wt 208 lb 3.2 oz (94.4 kg)   SpO2 97%   BMI 27.47 kg/m   Physical Exam  Constitutional: He is oriented to person, place, and time. He appears well-developed and well-nourished.  Cardiovascular: Normal rate.   Pulmonary/Chest: Effort normal.  Neurological: He is alert and oriented to person, place, and time.   Assessment and Plan :   1. High risk sexual behavior 2. History of syphilis - Discussed case with Dr. Nolon Rod. We will treat him empirically again today for syphilis. Labs pending. Counseled and emphasized safe sex practices.  Jaynee Eagles, PA-C [facility]  at [facility] [phone] 11/26/2016  5:58 PM

## 2016-11-26 NOTE — Patient Instructions (Addendum)
Safe Sex Practicing safe sex means taking steps before and during sex to reduce your risk of:  Getting an STD (sexually transmitted disease).  Giving your partner an STD.  Unwanted pregnancy. How can I practice safe sex?   To practice safe sex:  Limit your sexual partners to only one partner who is having sex with only you.  Avoid using alcohol and recreational drugs before having sex. These substances can affect your judgment.  Before having sex with a new partner:  Talk to your partner about past partners, past STDs, and drug use.  You and your partner should be screened for STDs and discuss the results with each other.  Check your body regularly for sores, blisters, rashes, or unusual discharge. If you notice any of these problems, visit your health care provider.  If you have symptoms of an infection or you are being treated for an STD, avoid sexual contact.  While having sex, use a condom. Make sure to:  Use a condom every time you have vaginal, oral, or anal sex. Both females and males should wear condoms during oral sex.  Keep condoms in place from the beginning to the end of sexual activity.  Use a latex condom, if possible. Latex condoms offer the best protection.  Use only water-based lubricants or oils to lubricate a condom. Using petroleum-based lubricants or oils will weaken the condom and increase the chance that it will break.  See your health care provider for regular screenings, exams, and tests for STDs.  Talk with your health care provider about the form of birth control (contraception) that is best for you.  Get vaccinated against hepatitis B and human papillomavirus (HPV).  If you are at risk of being infected with HIV (human immunodeficiency virus), talk with your health care provider about taking a prescription medicine to prevent HIV infection. You are considered at risk for HIV if:  You are a man who has sex with other men.  You are a  heterosexual man or woman who is sexually active with more than one partner.  You take drugs by injection.  You are sexually active with a partner who has HIV. This information is not intended to replace advice given to you by your health care provider. Make sure you discuss any questions you have with your health care provider. Document Released: 08/22/2004 Document Revised: 11/29/2015 Document Reviewed: 06/04/2015 Elsevier Interactive Patient Education  2017 [employer].     IF you received an x-ray today, you will receive an invoice from [facility]. Please contact [facility] at [phone] with questions or concerns regarding your invoice.   IF you received labwork today, you will receive an invoice from [place]. Please contact [facility] at [phone] with questions or concerns regarding your invoice.   Our billing staff will not be able to assist you with questions regarding bills from these companies.  You will be contacted with the lab results as soon as they are available. The fastest way to get your results is to activate your My Chart account. Instructions are located on the last page of this paperwork. If you have not heard from [place] regarding the results in 2 weeks, please contact this office.

## 2016-11-27 LAB — RPR: RPR: REACTIVE — AB

## 2016-11-27 LAB — HIV ANTIBODY (ROUTINE TESTING W REFLEX): HIV Screen 4th Generation wRfx: NONREACTIVE

## 2016-11-27 LAB — GC/CHLAMYDIA PROBE AMP
Chlamydia trachomatis, NAA: NEGATIVE
NEISSERIA GONORRHOEAE BY PCR: NEGATIVE

## 2016-11-27 LAB — RPR, QUANT+TP ABS (REFLEX): TREPONEMA PALLIDUM AB: NEGATIVE

## 2016-11-28 LAB — TRICHOMONAS VAGINALIS, PROBE AMP: TRICH VAG BY NAA: NEGATIVE

## 2017-01-14 ENCOUNTER — Other Ambulatory Visit: Payer: Self-pay | Admitting: Urgent Care

## 2017-06-25 ENCOUNTER — Encounter: Payer: Self-pay | Admitting: Emergency Medicine

## 2017-06-25 ENCOUNTER — Ambulatory Visit: Payer: Medicare Other | Admitting: Emergency Medicine

## 2017-06-25 VITALS — BP 132/76 | HR 58 | Temp 97.6°F | Resp 16 | Ht 73.0 in | Wt 210.0 lb

## 2017-06-25 DIAGNOSIS — Z202 Contact with and (suspected) exposure to infections with a predominantly sexual mode of transmission: Secondary | ICD-10-CM

## 2017-06-25 DIAGNOSIS — R4582 Worries: Secondary | ICD-10-CM | POA: Diagnosis not present

## 2017-06-25 DIAGNOSIS — Z113 Encounter for screening for infections with a predominantly sexual mode of transmission: Secondary | ICD-10-CM

## 2017-06-25 NOTE — Patient Instructions (Addendum)
IF you received an x-ray today, you will receive an invoice from [facility]. Please contact [facility] at [phone] with questions or concerns regarding your invoice.   IF you received labwork today, you will receive an invoice from [place]. Please contact [facility] at [phone] with questions or concerns regarding your invoice.   Our billing staff will not be able to assist you with questions regarding bills from these companies.  You will be contacted with the lab results as soon as they are available. The fastest way to get your results is to activate your My Chart account. Instructions are located on the last page of this paperwork. If you have not heard from [place] regarding the results in 2 weeks, please contact this office.     Sexually Transmitted Disease A sexually transmitted disease (STD) is a disease or infection that may be passed (transmitted) from person to person, usually during sexual activity. This may happen by way of saliva, semen, blood, vaginal mucus, or urine. Common STDs include:  Gonorrhea.  Chlamydia.  Syphilis.  HIV and AIDS.  Genital herpes.  Hepatitis B and C.  Trichomonas.  Human papillomavirus (HPV).  Pubic lice.  Scabies.  Mites.  Bacterial vaginosis.  What are the causes? An STD may be caused by bacteria, a virus, or parasites. STDs are often transmitted during sexual activity if one person is infected. However, they may also be transmitted through nonsexual means. STDs may be transmitted after:  Sexual intercourse with an infected person.  Sharing sex toys with an infected person.  Sharing needles with an infected person or using unclean piercing or tattoo needles.  Having intimate contact with the genitals, mouth, or rectal areas of an infected person.  Exposure to infected fluids during birth.  What are the signs or symptoms? Different STDs have different symptoms. Some people may not have any  symptoms. If symptoms are present, they may include:  Painful or bloody urination.  Pain in the pelvis, abdomen, vagina, anus, throat, or eyes.  A skin rash, itching, or irritation.  Growths, ulcerations, blisters, or sores in the genital and anal areas.  Abnormal vaginal discharge with or without bad odor.  Penile discharge in men.  Fever.  Pain or bleeding during sexual intercourse.  Swollen glands in the groin area.  Yellow skin and eyes (jaundice). This is seen with hepatitis.  Swollen testicles.  Infertility.  Sores and blisters in the mouth.  How is this diagnosed? To make a diagnosis, your health care provider may:  Take a medical history.  Perform a physical exam.  Take a sample of any discharge to examine.  Swab the throat, cervix, opening to the penis, rectum, or vagina for testing.  Test a sample of your first morning urine.  Perform blood tests.  Perform a Pap test, if this applies.  Perform a colposcopy.  Perform a laparoscopy.  How is this treated? Treatment depends on the STD. Some STDs may be treated but not cured.  Chlamydia, gonorrhea, trichomonas, and syphilis can be cured with antibiotic medicine.  Genital herpes, hepatitis, and HIV can be treated, but not cured, with prescribed medicines. The medicines lessen symptoms.  Genital warts from HPV can be treated with medicine or by freezing, burning (electrocautery), or surgery. Warts may come back.  HPV cannot be cured with medicine or surgery. However, abnormal areas may be removed from the cervix, vagina, or vulva.  If your diagnosis is confirmed, your recent sexual partners need treatment. This is  true even if they are symptom-free or have a negative culture or evaluation. They should not have sex until their health care providers say it is okay.  Your health care provider may test you for infection again 3 months after treatment.  How is this prevented? Take these steps to reduce  your risk of getting an STD:  Use latex condoms, dental dams, and water-soluble lubricants during sexual activity. Do not use petroleum jelly or oils.  Avoid having multiple sex partners.  Do not have sex with someone who has other sex partners.  Do not have sex with anyone you do not know or who is at high risk for an STD.  Avoid risky sex practices that can break your skin.  Do not have sex if you have open sores on your mouth or skin.  Avoid drinking too much alcohol or taking illegal drugs. Alcohol and drugs can affect your judgment and put you in a vulnerable position.  Avoid engaging in oral and anal sex acts.  Get vaccinated for HPV and hepatitis. If you have not received these vaccines in the past, talk to your health care provider about whether one or both might be right for you.  If you are at risk of being infected with HIV, it is recommended that you take a prescription medicine daily to prevent HIV infection. This is called pre-exposure prophylaxis (PrEP). You are considered at risk if: ? You are a man who has sex with other men (MSM). ? You are a heterosexual man or woman and are sexually active with more than one partner. ? You take drugs by injection. ? You are sexually active with a partner who has HIV.  Talk with your health care provider about whether you are at high risk of being infected with HIV. If you choose to begin PrEP, you should first be tested for HIV. You should then be tested every 3 months for as long as you are taking PrEP.  Contact a health care provider if:  See your health care provider.  Tell your sexual partner(s). They should be tested and treated for any STDs.  Do not have sex until your health care provider says it is okay. Get help right away if: Contact your health care provider right away if:  You have severe abdominal pain.  You are a man and notice swelling or pain in your testicles.  You are a woman and notice swelling or pain  in your vagina.  This information is not intended to replace advice given to you by your health care provider. Make sure you discuss any questions you have with your health care provider. Document Released: 10/05/2002 Document Revised: 02/02/2016 Document Reviewed: 02/02/2013 Elsevier Interactive Patient Education  2018 [employer].

## 2017-06-25 NOTE — Progress Notes (Signed)
[name] 71 y.o.   Chief Complaint  Patient presents with  . Exposure to STD    has had to episodes of unprotected sex within the past two months. Partner 2 weeks ago stated she had chlamydia. Pt is asymptomatic but would like for STD screening. Hx of syphillis    HISTORY OF PRESENT ILLNESS: This is a 71 y.o. male complaining of possible Chlamydia exposure from unprotected sex. Asymptomatic.  HPI   Prior to Admission medications   Medication Sig Start Date End Date Taking? Authorizing Provider  minocycline (MINOCIN,DYNACIN) 100 MG capsule Take 100 mg by mouth 2 (two) times a week.    Yes [provider]  Multiple Vitamin (MULTIVITAMIN WITH MINERALS) TABS tablet Take 1 tablet by mouth daily.   Yes [provider]  Rivaroxaban (XARELTO STARTER PACK) 15 & 20 MG TBPK Take as directed on package: Start with one 15mg  tablet by mouth twice a day with food. On Day 22, switch to one 20mg  tablet once a day with food. 08/16/15  Yes [name], MD  sertraline (ZOLOFT) 25 MG tablet Take 25 mg by mouth daily.   Yes [provider]  sildenafil (REVATIO) 20 MG tablet Take 20 mg by mouth as needed.   Yes [provider]  simvastatin (ZOCOR) 80 MG tablet Take 80 mg by mouth at bedtime. 06/18/15  Yes [provider]  UNABLE TO FIND TRI [place]   Yes [provider]  valACYclovir (VALTREX) 1000 MG tablet TAKE 1 TABLET (1,000 MG TOTAL) BY MOUTH DAILY. Patient not taking: Reported on 06/25/2017 02/02/16   [name], PA-C  vardenafil (LEVITRA) 20 MG tablet Take 1 tablet (20 mg total) by mouth daily as needed for erectile dysfunction. 04/04/12 11/03/13  [name], MD    No Known Allergies  Patient Active Problem List   Diagnosis Date Noted  . 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 Positive RPR test 05/01/2016  . Pulmonary emboli ([place]) 08/15/2015  . Pulmonary embolism ([place]) 08/15/2015    Past Medical  History:  Diagnosis Date  . Hyperlipidemia   . Low testosterone   . Prediabetes    "prediabetic; lost weight; increased activity; now blood sugar is fine" (08/16/2015  . Pulmonary embolism ([place]) 08/15/2015   "bilateral large clots"  . Rosacea   . Skin cancer    "left groin area"  . Walking pneumonia 1990s X 1    Past Surgical History:  Procedure Laterality Date  . APPENDECTOMY  1967  . CHOLECYSTECTOMY OPEN  ~ 2006  . ORIF TIBIA & FIBULA FRACTURES Right 2005   "has a rod in it now"  . TONSILLECTOMY  1950s    Social History   Socioeconomic History  . Marital status: Legally Separated    Spouse name: Not on file  . Number of children: Not on file  . Years of education: Not on file  . Highest education level: Not on file  Social Needs  . Financial resource strain: Not on file  . Food insecurity - worry: Not on file  . Food insecurity - inability: Not on file  . Transportation needs - medical: Not on file  . Transportation needs - non-medical: Not on file  Occupational History  . Not on file  Tobacco Use  . Smoking status: Former Smoker    Years: 10.00    Types: Cigarettes  . Smokeless tobacco: Never Used  . Tobacco comment: 08/16/2015 "smoked very infrequent; smoked the most in  my teens; none for 2-3 years"  Substance and Sexual Activity  . Alcohol use: Yes    Alcohol/week: 1.2 oz    Types: 2 Shots of liquor per week    Comment: "I like moonshine or whiskey"  . Drug use: Yes    Types: Marijuana    Comment: 08/16/2015 "I have smoked weed a a couple times"  . Sexual activity: Yes  Other Topics Concern  . Not on file  Social History Narrative  . Not on file    Family History  Problem Relation Age of Onset  . Heart attack Father        Early onset, mid-30s  . Cancer Mother        Esophagus or stomach     Review of Systems  Constitutional: Negative.  Negative for chills and fever.  HENT: Negative for sore throat.   Eyes: Negative for discharge and redness.   Respiratory: Negative for shortness of breath.   Cardiovascular: Negative for palpitations.  Gastrointestinal: Negative for abdominal pain, diarrhea, nausea and vomiting.  Genitourinary: Negative for dysuria, frequency and hematuria.  Musculoskeletal: Negative for back pain and myalgias.  Skin: Negative.  Negative for rash.  Neurological: Negative for dizziness and headaches.  Endo/Heme/Allergies: Negative.   All other systems reviewed and are negative.  Vitals:   06/25/17 1229 06/25/17 1242  BP: (!) 162/80 132/76  Pulse: (!) 58   Resp: 16   Temp: 97.6 F (36.4 C)   SpO2: 98%      Physical Exam  Constitutional: He is oriented to person, place, and time. He appears well-developed and well-nourished.  HENT:  Head: Normocephalic and atraumatic.  Eyes: Pupils are equal, round, and reactive to light.  Neck: Normal range of motion.  Cardiovascular: Normal rate.  Pulmonary/Chest: Effort normal.  Musculoskeletal: Normal range of motion.  Neurological: He is alert and oriented to person, place, and time.  Skin: Skin is warm and dry. Capillary refill takes less than 2 seconds.  Psychiatric: He has a normal mood and affect. His behavior is normal.  Vitals reviewed.    ASSESSMENT & PLAN: Kinney was seen today for exposure to std.  Diagnoses and all orders for this visit:  Worries  Exposure to chlamydia Comments: asymptomatic Orders: -     GC/Chlamydia Probe Amp -     Urine Culture  Screening examination for STD (sexually transmitted disease) -     HIV antibody -     RPR -     T pallidum Screening [place]   Patient Instructions       IF you received an x-ray today, you will receive an invoice from [facility]. Please contact [facility] at [phone] with questions or concerns regarding your invoice.   IF you received labwork today, you will receive an invoice from [place]. Please contact [facility] at [phone] with questions or concerns  regarding your invoice.   Our billing staff will not be able to assist you with questions regarding bills from these companies.  You will be contacted with the lab results as soon as they are available. The fastest way to get your results is to activate your My Chart account. Instructions are located on the last page of this paperwork. If you have not heard from [place] regarding the results in 2 weeks, please contact this office.     Sexually Transmitted Disease A sexually transmitted disease (STD) is a disease or infection that may be passed (transmitted) from person to person, usually during sexual  activity. This may happen by way of saliva, semen, blood, vaginal mucus, or urine. Common STDs include:  Gonorrhea.  Chlamydia.  Syphilis.  HIV and AIDS.  Genital herpes.  Hepatitis B and C.  Trichomonas.  Human papillomavirus (HPV).  Pubic lice.  Scabies.  Mites.  Bacterial vaginosis.  What are the causes? An STD may be caused by bacteria, a virus, or parasites. STDs are often transmitted during sexual activity if one person is infected. However, they may also be transmitted through nonsexual means. STDs may be transmitted after:  Sexual intercourse with an infected person.  Sharing sex toys with an infected person.  Sharing needles with an infected person or using unclean piercing or tattoo needles.  Having intimate contact with the genitals, mouth, or rectal areas of an infected person.  Exposure to infected fluids during birth.  What are the signs or symptoms? Different STDs have different symptoms. Some people may not have any symptoms. If symptoms are present, they may include:  Painful or bloody urination.  Pain in the pelvis, abdomen, vagina, anus, throat, or eyes.  A skin rash, itching, or irritation.  Growths, ulcerations, blisters, or sores in the genital and anal areas.  Abnormal vaginal discharge with or without bad odor.  Penile discharge in  men.  Fever.  Pain or bleeding during sexual intercourse.  Swollen glands in the groin area.  Yellow skin and eyes (jaundice). This is seen with hepatitis.  Swollen testicles.  Infertility.  Sores and blisters in the mouth.  How is this diagnosed? To make a diagnosis, your health care provider may:  Take a medical history.  Perform a physical exam.  Take a sample of any discharge to examine.  Swab the throat, cervix, opening to the penis, rectum, or vagina for testing.  Test a sample of your first morning urine.  Perform blood tests.  Perform a Pap test, if this applies.  Perform a colposcopy.  Perform a laparoscopy.  How is this treated? Treatment depends on the STD. Some STDs may be treated but not cured.  Chlamydia, gonorrhea, trichomonas, and syphilis can be cured with antibiotic medicine.  Genital herpes, hepatitis, and HIV can be treated, but not cured, with prescribed medicines. The medicines lessen symptoms.  Genital warts from HPV can be treated with medicine or by freezing, burning (electrocautery), or surgery. Warts may come back.  HPV cannot be cured with medicine or surgery. However, abnormal areas may be removed from the cervix, vagina, or vulva.  If your diagnosis is confirmed, your recent sexual partners need treatment. This is true even if they are symptom-free or have a negative culture or evaluation. They should not have sex until their health care providers say it is okay.  Your health care provider may test you for infection again 3 months after treatment.  How is this prevented? Take these steps to reduce your risk of getting an STD:  Use latex condoms, dental dams, and water-soluble lubricants during sexual activity. Do not use petroleum jelly or oils.  Avoid having multiple sex partners.  Do not have sex with someone who has other sex partners.  Do not have sex with anyone you do not know or who is at high risk for an STD.  Avoid  risky sex practices that can break your skin.  Do not have sex if you have open sores on your mouth or skin.  Avoid drinking too much alcohol or taking illegal drugs. Alcohol and drugs can affect your judgment and put  you in a vulnerable position.  Avoid engaging in oral and anal sex acts.  Get vaccinated for HPV and hepatitis. If you have not received these vaccines in the past, talk to your health care provider about whether one or both might be right for you.  If you are at risk of being infected with HIV, it is recommended that you take a prescription medicine daily to prevent HIV infection. This is called pre-exposure prophylaxis (PrEP). You are considered at risk if: ? You are a man who has sex with other men (MSM). ? You are a heterosexual man or woman and are sexually active with more than one partner. ? You take drugs by injection. ? You are sexually active with a partner who has HIV.  Talk with your health care provider about whether you are at high risk of being infected with HIV. If you choose to begin PrEP, you should first be tested for HIV. You should then be tested every 3 months for as long as you are taking PrEP.  Contact a health care provider if:  See your health care provider.  Tell your sexual partner(s). They should be tested and treated for any STDs.  Do not have sex until your health care provider says it is okay. Get help right away if: Contact your health care provider right away if:  You have severe abdominal pain.  You are a man and notice swelling or pain in your testicles.  You are a woman and notice swelling or pain in your vagina.  This information is not intended to replace advice given to you by your health care provider. Make sure you discuss any questions you have with your health care provider. Document Released: 10/05/2002 Document Revised: 02/02/2016 Document Reviewed: 02/02/2013 Elsevier Interactive Patient Education  2018 Elsevier  Inc.      Agustina Caroli, MD [facility]

## 2017-06-26 LAB — GC/CHLAMYDIA PROBE AMP
CHLAMYDIA, DNA PROBE: NEGATIVE
Neisseria gonorrhoeae by PCR: NEGATIVE

## 2017-06-26 LAB — HIV ANTIBODY (ROUTINE TESTING W REFLEX): HIV SCREEN 4TH GENERATION: NONREACTIVE

## 2017-06-26 LAB — URINE CULTURE: ORGANISM ID, BACTERIA: NO GROWTH

## 2017-06-26 LAB — T PALLIDUM SCREENING CASCADE: T pallidum Antibodies (TP-PA): NEGATIVE

## 2017-06-26 LAB — RPR: RPR Ser Ql: REACTIVE — AB

## 2017-06-26 LAB — RPR, QUANT+TP ABS (REFLEX)
Rapid Plasma Reagin, Quant: 1:8 {titer} — ABNORMAL HIGH
TREPONEMA PALLIDUM AB: NEGATIVE

## 2017-06-27 ENCOUNTER — Encounter: Payer: Self-pay | Admitting: Radiology

## 2018-04-02 ENCOUNTER — Encounter: Payer: Self-pay | Admitting: Urgent Care

## 2018-04-02 ENCOUNTER — Ambulatory Visit (INDEPENDENT_AMBULATORY_CARE_PROVIDER_SITE_OTHER): Payer: Medicare Other | Admitting: Urgent Care

## 2018-04-02 VITALS — BP 115/70 | HR 63 | Temp 98.0°F | Resp 18 | Ht 73.0 in | Wt 210.2 lb

## 2018-04-02 DIAGNOSIS — Z8619 Personal history of other infectious and parasitic diseases: Secondary | ICD-10-CM

## 2018-04-02 DIAGNOSIS — Z7251 High risk heterosexual behavior: Secondary | ICD-10-CM

## 2018-04-02 NOTE — Progress Notes (Signed)
    MRN: [id number] DOB: Apr 13, 1946  Subjective:   [name] is a 72 y.o. male presenting for STI check. Has been having unprotected sex with 1 new male partner. Denies dysuria, hematuria, urinary frequency, penile discharge, penile swelling, testicular pain, testicular swelling, anal pain, groin pain. He  reports that he has quit smoking. His smoking use included cigarettes. He quit after 10.00 years of use. He has never used smokeless tobacco. He reports that he drinks about 2.0 standard drinks of alcohol per week. He reports that he has current or past drug history. Drug: Marijuana.   [name] has a current medication list which includes the following prescription(s): fortesta, minocycline, multivitamin with minerals, rivaroxaban, sertraline, sildenafil, simvastatin, UNABLE TO FIND, valacyclovir, xarelto, and vardenafil. Also has No Known Allergies.  [name]  has a past medical history of Hyperlipidemia, Low testosterone, Prediabetes, Pulmonary embolism ([employer]) (08/15/2015), Rosacea, Skin cancer, and Walking pneumonia (1990s X 1). Also  has a past surgical history that includes Appendectomy (1937); Tonsillectomy (1950s); Cholecystectomy open (~ 2006); and ORIF tibia & fibula fractures (Right, 2005).  Objective:   Vitals: BP 115/70   Pulse 63   Temp 98 F (36.7 C) (Oral)   Resp 18   Ht 6\' 1"  (1.854 m)   Wt 210 lb 3.2 oz (95.3 kg)   SpO2 98%   BMI 27.73 kg/m   Physical Exam  Constitutional: He is oriented to person, place, and time. He appears well-developed and well-nourished.  Cardiovascular: Normal rate.  Pulmonary/Chest: Effort normal.  Neurological: He is alert and oriented to person, place, and time.   Assessment and Plan :   High risk sexual behavior, unspecified type - Plan: HIV antibody, RPR, GC/Chlamydia Probe Amp, Trichomonas vaginalis, RNA  History of syphilis  Unprotected sex - Plan: HIV antibody, RPR, GC/Chlamydia Probe Amp, Trichomonas vaginalis, RNA  Labs  pending. Counseled on safe sex. Return-to-clinic precautions discussed, patient verbalized understanding.   Jaynee Eagles, PA-C [facility] [phone] 04/02/2018  11:59 AM

## 2018-04-02 NOTE — Patient Instructions (Addendum)
Safe Sex Practicing safe sex means taking steps before and during sex to reduce your risk of:  Getting an STD (sexually transmitted disease).  Giving your partner an STD.  Unwanted pregnancy.  How can I practice safe sex?  To practice safe sex:  Limit your sexual partners to only one partner who is having sex with only you.  Avoid using alcohol and recreational drugs before having sex. These substances can affect your judgment.  Before having sex with a new partner: ? Talk to your partner about past partners, past STDs, and drug use. ? You and your partner should be screened for STDs and discuss the results with each other.  Check your body regularly for sores, blisters, rashes, or unusual discharge. If you notice any of these problems, visit your health care provider.  If you have symptoms of an infection or you are being treated for an STD, avoid sexual contact.  While having sex, use a condom. Make sure to: ? Use a condom every time you have vaginal, oral, or anal sex. Both females and males should wear condoms during oral sex. ? Keep condoms in place from the beginning to the end of sexual activity. ? Use a latex condom, if possible. Latex condoms offer the best protection. ? Use only water-based lubricants or oils to lubricate a condom. Using petroleum-based lubricants or oils will weaken the condom and increase the chance that it will break.  See your health care provider for regular screenings, exams, and tests for STDs.  Talk with your health care provider about the form of birth control (contraception) that is best for you.  Get vaccinated against hepatitis B and human papillomavirus (HPV).  If you are at risk of being infected with HIV (human immunodeficiency virus), talk with your health care provider about taking a prescription medicine to prevent HIV infection. You are considered at risk for HIV if: ? You are a man who has sex with other men. ? You are a  heterosexual man or woman who is sexually active with more than one partner. ? You take drugs by injection. ? You are sexually active with a partner who has HIV.  This information is not intended to replace advice given to you by your health care provider. Make sure you discuss any questions you have with your health care provider. Document Released: 08/22/2004 Document Revised: 11/29/2015 Document Reviewed: 06/04/2015 Elsevier Interactive Patient Education  [employer].   If you have lab work done today you will be contacted with your lab results within the next 2 weeks.  If you have not heard from [place] then please contact us. The fastest way to get your results is to register for My Chart.   IF you received an x-ray today, you will receive an invoice from [facility]. Please contact [facility] at [phone] with questions or concerns regarding your invoice.   IF you received labwork today, you will receive an invoice from [place]. Please contact [facility] at [phone] with questions or concerns regarding your invoice.   Our billing staff will not be able to assist you with questions regarding bills from these companies.  You will be contacted with the lab results as soon as they are available. The fastest way to get your results is to activate your My Chart account. Instructions are located on the last page of this paperwork. If you have not heard from [place] regarding the results in 2 weeks, please contact this office.

## 2018-04-03 LAB — RPR, QUANT+TP ABS (REFLEX): TREPONEMA PALLIDUM AB: NEGATIVE

## 2018-04-03 LAB — GC/CHLAMYDIA PROBE AMP
CHLAMYDIA, DNA PROBE: NEGATIVE
Neisseria gonorrhoeae by PCR: NEGATIVE

## 2018-04-03 LAB — HIV ANTIBODY (ROUTINE TESTING W REFLEX): HIV Screen 4th Generation wRfx: NONREACTIVE

## 2018-04-03 LAB — TRICHOMONAS VAGINALIS, PROBE AMP: TRICH VAG BY NAA: NEGATIVE

## 2018-04-03 LAB — RPR: RPR Ser Ql: REACTIVE — AB

## 2019-08-04 ENCOUNTER — Other Ambulatory Visit: Payer: Self-pay

## 2019-08-04 ENCOUNTER — Encounter: Payer: Self-pay | Admitting: Registered Nurse

## 2019-08-04 ENCOUNTER — Other Ambulatory Visit (HOSPITAL_COMMUNITY)
Admission: RE | Admit: 2019-08-04 | Discharge: 2019-08-04 | Disposition: A | Discharge status: Home / Self Care | Payer: Medicare PPO | Source: Ambulatory Visit | Attending: Registered Nurse | Admitting: Registered Nurse

## 2019-08-04 ENCOUNTER — Ambulatory Visit (INDEPENDENT_AMBULATORY_CARE_PROVIDER_SITE_OTHER): Payer: Medicare PPO | Admitting: Registered Nurse

## 2019-08-04 VITALS — BP 146/67 | HR 85 | Temp 97.6°F | Ht 73.0 in | Wt 207.8 lb

## 2019-08-04 DIAGNOSIS — Z113 Encounter for screening for infections with a predominantly sexual mode of transmission: Secondary | ICD-10-CM | POA: Diagnosis not present

## 2019-08-04 NOTE — Patient Instructions (Signed)
° ° ° °  If you have lab work done today you will be contacted with your lab results within the next 2 weeks.  If you have not heard from us then please contact us. The fastest way to get your results is to register for My Chart. ° ° °IF you received an x-ray today, you will receive an invoice from [facility]. Please contact [facility] at [phone] with questions or concerns regarding your invoice.  ° °IF you received labwork today, you will receive an invoice from [facility]. Please contact [facility] at [phone] with questions or concerns regarding your invoice.  ° °Our billing staff will not be able to assist you with questions regarding bills from these companies. ° °You will be contacted with the lab results as soon as they are available. The fastest way to get your results is to activate your My Chart account. Instructions are located on the last page of this paperwork. If you have not heard from us regarding the results in 2 weeks, please contact this office. °  ° ° ° °

## 2019-08-04 NOTE — Progress Notes (Signed)
Acute Office Visit  Subjective:    Patient ID: [name], male    DOB: 04/17/1946, 74 y.o.   MRN: [id number]  Chief Complaint  Patient presents with  . STD Check    patient states he had unprotected sex with a new partner having no symptoms ,but wants to be sure about 4 weeks ago.     HPI Patient is in today for STD screening  No symptoms  New partner around 4 weeks ago - did not use protection. Unsure of partner's history and status. Would prefer to get tested to be safe.  No hx of STDs to his knowledge.  Past Medical History:  Diagnosis Date  . Hyperlipidemia   . Low testosterone   . Prediabetes    "prediabetic; lost weight; increased activity; now blood sugar is fine" (08/16/2015  . Pulmonary embolism ([place]) 08/15/2015   "bilateral large clots"  . Rosacea   . Skin cancer    "left groin area"  . Walking pneumonia 1990s X 1    Past Surgical History:  Procedure Laterality Date  . APPENDECTOMY  1967  . CHOLECYSTECTOMY OPEN  ~ 2006  . ORIF TIBIA & FIBULA FRACTURES Right 2005   "has a rod in it now"  . TONSILLECTOMY  1950s    Family History  Problem Relation Age of Onset  . Heart attack Father        Early onset, mid-30s  . Cancer Mother        Esophagus or stomach    Social History   Socioeconomic History  . Marital status: Legally Separated    Spouse name: Not on file  . Number of children: Not on file  . Years of education: Not on file  . Highest education level: Not on file  Occupational History  . Not on file  Tobacco Use  . Smoking status: Former Smoker    Years: 10.00    Types: Cigarettes  . Smokeless tobacco: Never Used  . Tobacco comment: 08/16/2015 "smoked very infrequent; smoked the most in my teens; none for 2-3 years"  Substance and Sexual Activity  . Alcohol use: Yes    Alcohol/week: 2.0 standard drinks    Types: 2 Shots of liquor per week    Comment: "I like moonshine or whiskey"  . Drug use: Yes    Types: Marijuana   Comment: 08/16/2015 "I have smoked weed a a couple times"  . Sexual activity: Yes  Other Topics Concern  . Not on file  Social History Narrative  . Not on file   Social Determinants of Health   Financial Resource Strain:   . Difficulty of Paying Living Expenses: Not on file  Food Insecurity:   . Worried About Charity fundraiser in the Last Year: Not on file  . Ran Out of Food in the Last Year: Not on file  Transportation Needs:   . Lack of Transportation (Medical): Not on file  . Lack of Transportation (Non-Medical): Not on file  Physical Activity:   . Days of Exercise per Week: Not on file  . Minutes of Exercise per Session: Not on file  Stress:   . Feeling of Stress : Not on file  Social Connections:   . Frequency of Communication with Friends and Family: Not on file  . Frequency of Social Gatherings with Friends and Family: Not on file  . Attends Religious Services: Not on file  . Active Member of Clubs or Organizations: Not on file  .  Attends Archivist Meetings: Not on file  . Marital Status: Not on file  Intimate Partner Violence:   . Fear of Current or Ex-Partner: Not on file  . Emotionally Abused: Not on file  . Physically Abused: Not on file  . Sexually Abused: Not on file    Outpatient Medications Prior to Visit  Medication Sig Dispense Refill  . FORTESTA 10 MG/ACT (2%) GEL     . minocycline (MINOCIN,DYNACIN) 100 MG capsule Take 100 mg by mouth 2 (two) times a week.     . Multiple Vitamin (MULTIVITAMIN WITH MINERALS) TABS tablet Take 1 tablet by mouth daily.    . Rivaroxaban (XARELTO STARTER PACK) 15 & 20 MG TBPK Take as directed on package: Start with one 15mg  tablet by mouth twice a day with food. On Day 22, switch to one 20mg  tablet once a day with food. 51 each 0  . sertraline (ZOLOFT) 25 MG tablet Take 25 mg by mouth daily.    . sildenafil (REVATIO) 20 MG tablet Take 20 mg by mouth as needed.    . simvastatin (ZOCOR) 80 MG tablet Take 80 mg by  mouth at bedtime.    Marland Kitchen UNABLE TO FIND TRI MIX INJECTION    . valACYclovir (VALTREX) 1000 MG tablet TAKE 1 TABLET (1,000 MG TOTAL) BY MOUTH DAILY. 30 tablet 0  . XARELTO 20 MG TABS tablet     . vardenafil (LEVITRA) 20 MG tablet Take 1 tablet (20 mg total) by mouth daily as needed for erectile dysfunction. 10 tablet 12   No facility-administered medications prior to visit.    No Known Allergies  Review of Systems  Constitutional: Negative.   HENT: Negative.   Eyes: Negative.   Respiratory: Negative.   Cardiovascular: Negative.   Gastrointestinal: Negative.   Endocrine: Negative.   Genitourinary: Negative.   Musculoskeletal: Negative.   Skin: Negative.   Allergic/Immunologic: Negative.   Neurological: Negative.   Hematological: Negative.   Psychiatric/Behavioral: Negative.   All other systems reviewed and are negative.      Objective:    Physical Exam Vitals and nursing note reviewed.  Constitutional:      General: He is not in acute distress.    Appearance: Normal appearance. He is normal weight. He is not ill-appearing, toxic-appearing or diaphoretic.  Cardiovascular:     Rate and Rhythm: Normal rate and regular rhythm.  Pulmonary:     Effort: Pulmonary effort is normal. No respiratory distress.  Neurological:     General: No focal deficit present.     Mental Status: He is alert and oriented to person, place, and time. Mental status is at baseline.  Psychiatric:        Mood and Affect: Mood normal.        Behavior: Behavior normal.        Thought Content: Thought content normal.        Judgment: Judgment normal.     BP (!) 146/67   Pulse 85   Temp 97.6 F (36.4 C) (Temporal)   Ht 6\' 1"  (1.854 m)   Wt 207 lb 12.8 oz (94.3 kg)   SpO2 98%   BMI 27.42 kg/m  Wt Readings from Last 3 Encounters:  08/04/19 207 lb 12.8 oz (94.3 kg)  04/02/18 210 lb 3.2 oz (95.3 kg)  06/25/17 210 lb (95.3 kg)    There are no preventive care reminders to display for this  patient.  There are no preventive care reminders to display for this  patient.   No results found for: TSH Lab Results  Component Value Date   WBC 8.8 08/16/2015   HGB 14.8 08/16/2015   HCT 43.2 08/16/2015   MCV 98.4 08/16/2015   PLT 153 08/16/2015   Lab Results  Component Value Date   NA 140 08/15/2015   K 4.6 08/15/2015   CO2 26 08/15/2015   GLUCOSE 134 (H) 08/15/2015   BUN 16 08/15/2015   CREATININE 1.11 08/15/2015   BILITOT 1.2 01/24/2009   ALKPHOS 50 01/24/2009   AST 24 01/24/2009   ALT 27 01/24/2009   PROT 7.5 01/24/2009   ALBUMIN 4.0 01/24/2009   CALCIUM 9.7 08/15/2015   ANIONGAP 9 08/15/2015   No results found for: CHOL No results found for: HDL No results found for: LDLCALC No results found for: TRIG No results found for: CHOLHDL No results found for: HGBA1C     Assessment & Plan:   Problem List Items Addressed This Visit    None    Visit Diagnoses    Screen for STD (sexually transmitted disease)    -  Primary   Relevant Orders   Urine cytology ancillary only   HIV antibody (with reflex)   RPR       No orders of the defined types were placed in this encounter.  PLAN  Urine collected for Gc/ct and trich. Blood drawn for HIV and RPR  Discussed incubation periods with the pt and suggest that he return in 3 mos for repeat testing  Suggest he use protection  Patient encouraged to call clinic with any questions, comments, or concerns.   Maximiano Coss, NP

## 2019-08-05 ENCOUNTER — Telehealth: Payer: Self-pay | Admitting: Registered Nurse

## 2019-08-05 LAB — URINE CYTOLOGY ANCILLARY ONLY
Chlamydia: NEGATIVE
Comment: NEGATIVE
Comment: NEGATIVE
Comment: NORMAL
Neisseria Gonorrhea: NEGATIVE
Trichomonas: NEGATIVE

## 2019-08-05 LAB — RPR, QUANT+TP ABS (REFLEX)
Rapid Plasma Reagin, Quant: 1:8 {titer} — ABNORMAL HIGH
T Pallidum Abs: NONREACTIVE

## 2019-08-05 LAB — HIV ANTIBODY (ROUTINE TESTING W REFLEX): HIV Screen 4th Generation wRfx: NONREACTIVE

## 2019-08-05 LAB — RPR: RPR Ser Ql: REACTIVE — AB

## 2019-08-05 NOTE — Telephone Encounter (Signed)
Copied from [place] [id number]. Topic: General - Other >> Aug 05, 2019 12:46 PM Yvette Rack wrote: Reason for CRM: Pt called regarding HIV test results. Pt requests call back with an explanation as to what non-reactive means.

## 2019-08-06 ENCOUNTER — Encounter: Payer: Self-pay | Admitting: Registered Nurse

## 2019-08-06 NOTE — Telephone Encounter (Signed)
Called pt and left message to call back. No answer

## 2019-08-06 NOTE — Telephone Encounter (Signed)
Please advise on message below pt wanted explanation on HIV results

## 2019-08-06 NOTE — Progress Notes (Signed)
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

## 2019-08-06 NOTE — Telephone Encounter (Signed)
Non-reactive is just the terminology used for negative in this case.  He has nothing to be concerned about at this time.  Thank you  Kathrin Ruddy, NP

## 2019-08-09 NOTE — Telephone Encounter (Signed)
Pt requesting a call back if in case there is anything he needs to worry about

## 2019-08-09 NOTE — Telephone Encounter (Signed)
Attempted to call pt no answer.  Left general message to call back   If he calls back we can let him know no need to worry at this time.

## 2020-09-07 DIAGNOSIS — L918 Other hypertrophic disorders of the skin: Secondary | ICD-10-CM | POA: Diagnosis not present

## 2020-09-07 DIAGNOSIS — L570 Actinic keratosis: Secondary | ICD-10-CM | POA: Diagnosis not present

## 2020-10-27 DIAGNOSIS — A6000 Herpesviral infection of urogenital system, unspecified: Secondary | ICD-10-CM | POA: Diagnosis not present

## 2020-10-27 DIAGNOSIS — E782 Mixed hyperlipidemia: Secondary | ICD-10-CM | POA: Diagnosis not present

## 2020-10-27 DIAGNOSIS — E291 Testicular hypofunction: Secondary | ICD-10-CM | POA: Diagnosis not present

## 2020-10-27 DIAGNOSIS — E1151 Type 2 diabetes mellitus with diabetic peripheral angiopathy without gangrene: Secondary | ICD-10-CM | POA: Diagnosis not present

## 2020-10-27 DIAGNOSIS — L719 Rosacea, unspecified: Secondary | ICD-10-CM | POA: Diagnosis not present

## 2020-10-27 DIAGNOSIS — E1165 Type 2 diabetes mellitus with hyperglycemia: Secondary | ICD-10-CM | POA: Diagnosis not present

## 2020-10-27 DIAGNOSIS — R030 Elevated blood-pressure reading, without diagnosis of hypertension: Secondary | ICD-10-CM | POA: Diagnosis not present

## 2020-10-27 DIAGNOSIS — F524 Premature ejaculation: Secondary | ICD-10-CM | POA: Diagnosis not present

## 2020-10-27 DIAGNOSIS — N529 Male erectile dysfunction, unspecified: Secondary | ICD-10-CM | POA: Diagnosis not present

## 2020-11-27 DIAGNOSIS — R030 Elevated blood-pressure reading, without diagnosis of hypertension: Secondary | ICD-10-CM | POA: Diagnosis not present

## 2020-11-27 DIAGNOSIS — N529 Male erectile dysfunction, unspecified: Secondary | ICD-10-CM | POA: Diagnosis not present

## 2021-02-13 DIAGNOSIS — Z1389 Encounter for screening for other disorder: Secondary | ICD-10-CM | POA: Diagnosis not present

## 2021-02-13 DIAGNOSIS — E782 Mixed hyperlipidemia: Secondary | ICD-10-CM | POA: Diagnosis not present

## 2021-02-13 DIAGNOSIS — I825Z1 Chronic embolism and thrombosis of unspecified deep veins of right distal lower extremity: Secondary | ICD-10-CM | POA: Diagnosis not present

## 2021-02-13 DIAGNOSIS — E291 Testicular hypofunction: Secondary | ICD-10-CM | POA: Diagnosis not present

## 2021-02-13 DIAGNOSIS — E1165 Type 2 diabetes mellitus with hyperglycemia: Secondary | ICD-10-CM | POA: Diagnosis not present

## 2021-02-13 DIAGNOSIS — N529 Male erectile dysfunction, unspecified: Secondary | ICD-10-CM | POA: Diagnosis not present

## 2021-02-13 DIAGNOSIS — E1151 Type 2 diabetes mellitus with diabetic peripheral angiopathy without gangrene: Secondary | ICD-10-CM | POA: Diagnosis not present

## 2021-02-13 DIAGNOSIS — Z0000 Encounter for general adult medical examination without abnormal findings: Secondary | ICD-10-CM | POA: Diagnosis not present

## 2021-02-13 DIAGNOSIS — Z125 Encounter for screening for malignant neoplasm of prostate: Secondary | ICD-10-CM | POA: Diagnosis not present

## 2021-02-13 DIAGNOSIS — L719 Rosacea, unspecified: Secondary | ICD-10-CM | POA: Diagnosis not present

## 2021-03-29 DIAGNOSIS — E291 Testicular hypofunction: Secondary | ICD-10-CM | POA: Diagnosis not present

## 2021-05-14 DIAGNOSIS — Z23 Encounter for immunization: Secondary | ICD-10-CM | POA: Diagnosis not present

## 2021-11-08 DIAGNOSIS — N529 Male erectile dysfunction, unspecified: Secondary | ICD-10-CM | POA: Diagnosis not present

## 2021-11-08 DIAGNOSIS — I781 Nevus, non-neoplastic: Secondary | ICD-10-CM | POA: Diagnosis not present

## 2021-11-08 DIAGNOSIS — L719 Rosacea, unspecified: Secondary | ICD-10-CM | POA: Diagnosis not present

## 2021-11-08 DIAGNOSIS — E782 Mixed hyperlipidemia: Secondary | ICD-10-CM | POA: Diagnosis not present

## 2021-11-08 DIAGNOSIS — E1165 Type 2 diabetes mellitus with hyperglycemia: Secondary | ICD-10-CM | POA: Diagnosis not present

## 2021-11-08 DIAGNOSIS — I825Z1 Chronic embolism and thrombosis of unspecified deep veins of right distal lower extremity: Secondary | ICD-10-CM | POA: Diagnosis not present

## 2021-11-08 DIAGNOSIS — F524 Premature ejaculation: Secondary | ICD-10-CM | POA: Diagnosis not present

## 2021-11-08 DIAGNOSIS — A6000 Herpesviral infection of urogenital system, unspecified: Secondary | ICD-10-CM | POA: Diagnosis not present

## 2021-11-08 DIAGNOSIS — E291 Testicular hypofunction: Secondary | ICD-10-CM | POA: Diagnosis not present

## 2022-01-03 ENCOUNTER — Observation Stay (HOSPITAL_COMMUNITY)
Admission: EM | Admit: 2022-01-03 | Discharge: 2022-01-04 | Disposition: A | Discharge status: Home / Self Care | Payer: Medicare PPO | Attending: Surgery | Admitting: Surgery

## 2022-01-03 ENCOUNTER — Emergency Department (HOSPITAL_COMMUNITY): Payer: Medicare PPO

## 2022-01-03 ENCOUNTER — Other Ambulatory Visit: Payer: Self-pay

## 2022-01-03 DIAGNOSIS — S0219XB Other fracture of base of skull, initial encounter for open fracture: Principal | ICD-10-CM | POA: Insufficient documentation

## 2022-01-03 DIAGNOSIS — R7303 Prediabetes: Secondary | ICD-10-CM | POA: Insufficient documentation

## 2022-01-03 DIAGNOSIS — S0182XA Laceration with foreign body of other part of head, initial encounter: Secondary | ICD-10-CM | POA: Insufficient documentation

## 2022-01-03 DIAGNOSIS — S069X1A Unspecified intracranial injury with loss of consciousness of 30 minutes or less, initial encounter: Secondary | ICD-10-CM | POA: Diagnosis present

## 2022-01-03 DIAGNOSIS — S199XXA Unspecified injury of neck, initial encounter: Secondary | ICD-10-CM | POA: Diagnosis not present

## 2022-01-03 DIAGNOSIS — W01198A Fall on same level from slipping, tripping and stumbling with subsequent striking against other object, initial encounter: Secondary | ICD-10-CM | POA: Insufficient documentation

## 2022-01-03 DIAGNOSIS — Y92008 Other place in unspecified non-institutional (private) residence as the place of occurrence of the external cause: Secondary | ICD-10-CM | POA: Insufficient documentation

## 2022-01-03 DIAGNOSIS — S020XXA Fracture of vault of skull, initial encounter for closed fracture: Secondary | ICD-10-CM | POA: Diagnosis not present

## 2022-01-03 DIAGNOSIS — G319 Degenerative disease of nervous system, unspecified: Secondary | ICD-10-CM | POA: Diagnosis not present

## 2022-01-03 DIAGNOSIS — R2681 Unsteadiness on feet: Secondary | ICD-10-CM | POA: Insufficient documentation

## 2022-01-03 DIAGNOSIS — R000 Tachycardia, unspecified: Secondary | ICD-10-CM | POA: Diagnosis not present

## 2022-01-03 DIAGNOSIS — Y9302 Activity, running: Secondary | ICD-10-CM | POA: Diagnosis not present

## 2022-01-03 DIAGNOSIS — Z87891 Personal history of nicotine dependence: Secondary | ICD-10-CM | POA: Diagnosis not present

## 2022-01-03 DIAGNOSIS — I10 Essential (primary) hypertension: Secondary | ICD-10-CM | POA: Diagnosis not present

## 2022-01-03 DIAGNOSIS — Z7984 Long term (current) use of oral hypoglycemic drugs: Secondary | ICD-10-CM | POA: Diagnosis not present

## 2022-01-03 DIAGNOSIS — Z79899 Other long term (current) drug therapy: Secondary | ICD-10-CM | POA: Insufficient documentation

## 2022-01-03 DIAGNOSIS — S060XAA Concussion with loss of consciousness status unknown, initial encounter: Secondary | ICD-10-CM

## 2022-01-03 DIAGNOSIS — R42 Dizziness and giddiness: Secondary | ICD-10-CM | POA: Diagnosis not present

## 2022-01-03 DIAGNOSIS — H05222 Edema of left orbit: Secondary | ICD-10-CM | POA: Diagnosis not present

## 2022-01-03 DIAGNOSIS — S0181XA Laceration without foreign body of other part of head, initial encounter: Secondary | ICD-10-CM | POA: Diagnosis not present

## 2022-01-03 DIAGNOSIS — S12591A Other nondisplaced fracture of sixth cervical vertebra, initial encounter for closed fracture: Secondary | ICD-10-CM | POA: Diagnosis not present

## 2022-01-03 DIAGNOSIS — Z7901 Long term (current) use of anticoagulants: Secondary | ICD-10-CM | POA: Insufficient documentation

## 2022-01-03 DIAGNOSIS — S299XXA Unspecified injury of thorax, initial encounter: Secondary | ICD-10-CM | POA: Diagnosis not present

## 2022-01-03 DIAGNOSIS — W19XXXA Unspecified fall, initial encounter: Secondary | ICD-10-CM | POA: Diagnosis not present

## 2022-01-03 DIAGNOSIS — Z85828 Personal history of other malignant neoplasm of skin: Secondary | ICD-10-CM | POA: Diagnosis not present

## 2022-01-03 DIAGNOSIS — S0219XA Other fracture of base of skull, initial encounter for closed fracture: Secondary | ICD-10-CM | POA: Diagnosis not present

## 2022-01-03 DIAGNOSIS — S020XXB Fracture of vault of skull, initial encounter for open fracture: Principal | ICD-10-CM

## 2022-01-03 DIAGNOSIS — S0240DA Maxillary fracture, left side, initial encounter for closed fracture: Secondary | ICD-10-CM | POA: Insufficient documentation

## 2022-01-03 DIAGNOSIS — Z86718 Personal history of other venous thrombosis and embolism: Secondary | ICD-10-CM | POA: Insufficient documentation

## 2022-01-03 DIAGNOSIS — M7989 Other specified soft tissue disorders: Secondary | ICD-10-CM | POA: Diagnosis not present

## 2022-01-03 DIAGNOSIS — R9431 Abnormal electrocardiogram [ECG] [EKG]: Secondary | ICD-10-CM | POA: Diagnosis not present

## 2022-01-03 LAB — COMPREHENSIVE METABOLIC PANEL
ALT: 18 U/L (ref 0–44)
AST: 21 U/L (ref 15–41)
Albumin: 3.9 g/dL (ref 3.5–5.0)
Alkaline Phosphatase: 40 U/L (ref 38–126)
Anion gap: 9 (ref 5–15)
BUN: 17 mg/dL (ref 8–23)
CO2: 22 mmol/L (ref 22–32)
Calcium: 8.4 mg/dL — ABNORMAL LOW (ref 8.9–10.3)
Chloride: 106 mmol/L (ref 98–111)
Creatinine, Ser: 1.01 mg/dL (ref 0.61–1.24)
GFR, Estimated: >60 mL/min (ref >60)
Glucose, Bld: 164 mg/dL — ABNORMAL HIGH (ref 70–99)
Potassium: 3.6 mmol/L (ref 3.5–5.1)
Sodium: 137 mmol/L (ref 135–145)
Total Bilirubin: 0.9 mg/dL (ref 0.3–1.2)
Total Protein: 6.1 g/dL — ABNORMAL LOW (ref 6.5–8.1)

## 2022-01-03 LAB — CBC
HCT: 40.4 % (ref 39.0–52.0)
Hemoglobin: 14.4 g/dL (ref 13.0–17.0)
MCH: 35.1 pg — ABNORMAL HIGH (ref 26.0–34.0)
MCHC: 35.6 g/dL (ref 30.0–36.0)
MCV: 98.5 fL (ref 80.0–100.0)
Platelets: 165 10*3/uL (ref 150–400)
RBC: 4.1 MIL/uL — ABNORMAL LOW (ref 4.22–5.81)
RDW: 12.2 % (ref 11.5–15.5)
WBC: 7.8 10*3/uL (ref 4.0–10.5)
nRBC: 0 % (ref 0.0–0.2)

## 2022-01-03 LAB — TYPE AND SCREEN
ABO/RH(D): A POS
Antibody Screen: NEGATIVE

## 2022-01-03 MED ORDER — LIDOCAINE-EPINEPHRINE (PF) 2 %-1:200000 IJ SOLN
20.0000 mL | Freq: Once | INTRAMUSCULAR | Status: AC
  Administered 2022-01-04: 20 mL via INTRADERMAL
  Filled 2022-01-03: qty 20

## 2022-01-03 NOTE — Progress Notes (Signed)
Chaplain responded to this level II fall on thinners.  Patient not available at this time.  Apparently family has been notified, but not present at time of the visit.  Chaplain available as needed. [place], Mdiv.    01/03/22 2017  Clinical Encounter Type  Visited With Health care provider;Patient not available  Visit Type Initial;ED;Trauma  Referral From Nurse  Consult/Referral To Chaplain

## 2022-01-03 NOTE — ED Provider Notes (Signed)
[facility] EMERGENCY DEPARTMENT Provider Note   CSN: [id number] Arrival date & time: 01/03/22  1956     History  Chief Complaint  Patient presents with   [name] is a 76 y.o. male.  76 year old male with a past medical history of DVT on Xarelto presents to the ED via EMS as a level 2 trauma following a ground-level fall.  EMS states that the patient was playing outside with his grandchildren when he tripped and fell headfirst onto a concrete birdbath.  He reportedly lost consciousness for approximately 1-5 minutes.  Family expressed concern as the patient appeared to be confused with repetitive questioning.  EMS reportedly cleared cervical spine in route.  The history is provided by the patient and the EMS personnel.       Home Medications Prior to Admission medications   Medication Sig Start Date End Date Taking? Authorizing Provider  JARDIANCE 25 MG TABS tablet Take 25 mg by mouth daily. 10/17/21  Yes [provider]  metFORMIN (GLUCOPHAGE-XR) 500 MG 24 hr tablet Take 1,000 mg by mouth 2 (two) times daily. 11/09/21  Yes [provider]  minocycline (MINOCIN,DYNACIN) 100 MG capsule Take 100 mg by mouth 2 (two) times a week. No set days   Yes [provider]  SILDENAFIL CITRATE PO Take 1-3 tablets by mouth daily as needed (ED).   Yes [provider]  simvastatin (ZOCOR) 80 MG tablet Take 80 mg by mouth at bedtime. 06/18/15  Yes [provider]  valACYclovir (VALTREX) 1000 MG tablet TAKE 1 TABLET (1,000 MG TOTAL) BY MOUTH DAILY. Patient taking differently: Take 500 mg by mouth daily as needed (outbreaks). 02/02/16  Yes Weber, Sarah L, PA-C  XARELTO 20 MG TABS tablet Take 20 mg by mouth daily with supper. 03/29/18  Yes [provider]  Rivaroxaban (XARELTO STARTER PACK) 15 & 20 MG TBPK Take as directed on package: Start with one '15mg'$  tablet by mouth twice a day with food. On Day 22, switch to one '20mg'$   tablet once a day with food. Patient not taking: Reported on 01/04/2022 08/16/15   [name], MD      Allergies    Patient has no known allergies.    Review of Systems   Review of Systems  Cardiovascular:  Negative for chest pain.  Gastrointestinal:  Negative for abdominal pain.  Musculoskeletal:  Negative for neck pain.  Skin:  Positive for wound.  Neurological:  Positive for dizziness, syncope and headaches.  Psychiatric/Behavioral:  Positive for confusion.     Physical Exam Updated Vital Signs BP (!) 129/92   Pulse 82   Temp 97.6 F (36.4 C) (Oral)   Resp 16   Ht '6\' 1"'$  (1.854 m)   Wt 88.5 kg   SpO2 94%   BMI 25.73 kg/m  Physical Exam Vitals and nursing note reviewed.  Constitutional:      General: He is not in acute distress.    Appearance: He is well-developed.  HENT:     Head: Normocephalic.     Comments: 5 cm stellate laceration with underlying exposed skull present to the frontal scalp. No underlying skull instability. Midface stable to palpation Eyes:     Extraocular Movements: Extraocular movements intact.     Conjunctiva/sclera: Conjunctivae normal.     Comments: Left periorbital edema and ecchymosis.  Midface is stable to palpation.  Extraocular movements are intact without evidence of entrapment.  Cardiovascular:     Rate and  Rhythm: Normal rate and regular rhythm.     Pulses:          Radial pulses are 2+ on the right side and 2+ on the left side.       Dorsalis pedis pulses are 2+ on the right side and 2+ on the left side.     Heart sounds: No murmur heard. Pulmonary:     Effort: Pulmonary effort is normal. No respiratory distress.     Breath sounds: Normal breath sounds.  Chest:     Chest wall: No tenderness or crepitus.     Comments: Chest wall is stable to AP and lateral compression without underlying crepitus or tenderness. Abdominal:     Palpations: Abdomen is soft.     Tenderness: There is no abdominal tenderness.  Musculoskeletal:         General: No tenderness or deformity.     Comments: No midline cervical, thoracic, or lumbar tenderness to palpation.  No step-offs or deformities.  Pelvis stable to lateral compression.  Bilateral upper and lower extremities are otherwise atraumatic  Skin:    General: Skin is warm and dry.  Neurological:     Mental Status: He is alert.     GCS: GCS eye subscore is 4. GCS verbal subscore is 5. GCS motor subscore is 6.     Comments: Moving all 4 extremity spontaneously.  Active range of motion to the bilateral upper and lower extremities, specifically flexion extension at the bilateral elbows.  Sensation is equal and symmetric to the bilateral upper and lower extremities.  Psychiatric:        Mood and Affect: Mood normal.     ED Results / Procedures / Treatments   Labs (all labs ordered are listed, but only abnormal results are displayed) Labs Reviewed  COMPREHENSIVE METABOLIC PANEL - Abnormal; Notable for the following components:      Result Value   Glucose, Bld 164 (*)    Calcium 8.4 (*)    Total Protein 6.1 (*)    All other components within normal limits  CBC - Abnormal; Notable for the following components:   RBC 4.10 (*)    MCH 35.1 (*)    All other components within normal limits  URINALYSIS, ROUTINE W REFLEX MICROSCOPIC - Abnormal; Notable for the following components:   Glucose, UA >=500 (*)    Ketones, ur 20 (*)    All other components within normal limits  CBC  BASIC METABOLIC PANEL  HEMOGLOBIN A1C  TYPE AND SCREEN  ABO/RH    EKG None  Radiology DG Chest Port 1 View  Result Date: 01/03/2022 CLINICAL DATA:  Trauma. EXAM: PORTABLE CHEST 1 VIEW COMPARISON:  Chest x-ray 08/15/2015. FINDINGS: The heart size and mediastinal contours are within normal limits. Both lungs are clear. The visualized skeletal structures are unremarkable. IMPRESSION: No active disease. Electronically Signed   By: Ronney Asters M.D.   On: 01/03/2022 20:26    Procedures .Marland KitchenLaceration  Repair  Date/Time: 01/04/2022 1:55 AM  Performed by: Darionna Banke, [place], MD Authorized by: Blanchie Dessert, MD   Consent:    Consent obtained:  Verbal   Consent given by:  Patient   Risks discussed:  Pain, retained foreign body, poor cosmetic result and poor wound healing Universal protocol:    Procedure explained and questions answered to patient or proxy's satisfaction: yes     Patient identity confirmed:  Verbally with patient Anesthesia:    Anesthesia method:  Local infiltration   Local anesthetic:  Lidocaine 2% WITH epi Laceration details:    Location:  Face   Face location:  Forehead   Length (cm):  5 Pre-procedure details:    Preparation:  Patient was prepped and draped in usual sterile fashion Exploration:    Hemostasis achieved with:  Direct pressure   Wound exploration: entire depth of wound visualized     Wound extent: fascia violated, foreign bodies/material, muscle damage and vascular damage     Contaminated: yes (dirt and blades of grass noted in wound)   Treatment:    Area cleansed with:  Saline   Amount of cleaning:  Extensive   Irrigation solution:  Sterile saline   Irrigation volume:  1000cc   Irrigation method:  Pressure wash   Visualized foreign bodies/material removed: yes     Layers/structures repaired:  Janae Sauce:    Suture size:  4-0   Suture material:  Chromic gut   Suture technique:  Simple interrupted   Number of sutures:  4 Skin repair:    Repair method:  Sutures   Suture size:  4-0   Suture material:  Prolene   Suture technique:  Simple interrupted   Number of sutures:  10 Approximation:    Approximation:  Close Repair type:    Repair type:  Complex Post-procedure details:    Dressing:  Sterile dressing and non-adherent dressing   Procedure completion:  Tolerated well, no immediate complications     Medications Ordered in ED Medications  acetaminophen (TYLENOL) tablet 650 mg (has no administration in time range)  morphine (PF) 2  MG/ML injection 2-4 mg (has no administration in time range)  docusate sodium (COLACE) capsule 100 mg (has no administration in time range)  ondansetron (ZOFRAN-ODT) disintegrating tablet 4 mg (has no administration in time range)    Or  ondansetron (ZOFRAN) injection 4 mg (has no administration in time range)  oxyCODONE (Oxy IR/ROXICODONE) immediate release tablet 5 mg (has no administration in time range)  metoprolol tartrate (LOPRESSOR) injection 5 mg (has no administration in time range)  hydrALAZINE (APRESOLINE) injection 10 mg (has no administration in time range)  insulin aspart (novoLOG) injection 0-15 Units (has no administration in time range)  lidocaine-EPINEPHrine (XYLOCAINE W/EPI) 2 %-1:200000 (PF) injection 20 mL (20 mLs Intradermal Given by Other 01/04/22 0143)  fentaNYL (SUBLIMAZE) injection 50 mcg (50 mcg Intravenous Given 01/04/22 0122)    ED Course/ Medical Decision Making/ A&P                           Medical Decision Making Amount and/or Complexity of Data Reviewed Independent Historian: EMS Labs: ordered. Radiology: ordered and independent interpretation performed.  Risk Prescription drug management. Parenteral controlled substances. Decision regarding hospitalization. Emergency major surgery.   76 year old male with a history as above presents as a level 2 trauma following a ground-level fall.  On arrival, patient is hemodynamically stable.  Primary survey assessed with therapy and breathing intact, IV access was established.  Secondary survey been performed as documented above and notable for a large stellate laceration overlying the frontal scalp with underlying exposed skull and debris.  Otherwise GCS 15, moving all 4 extremities spontaneously neurologically intact.  Initial emergent differential includes skull fractures, intracranial hemorrhage, cerebral contusion, cervical fracture, spinal cord injury.  Lower suspicion for intrathoracic or intra-abdominal injury  in the absence of external trauma or tenderness on exam.  CT scans of the head, face, and cervical spine were obtained which I reviewed.  These  results were also discussed with the radiologist.  CT head concerning for a nondisplaced frontal skull fracture that extends into the left maxillary sinus, ethmoid sinus, and the left orbital roof.  CT cervical spine also notable for an unstable C6 fracture, consistent with hyperextension injury.  Patient was subsequently placed in a [place] J hard collar.  Lab work is largely reassuring, hemoglobin and platelets are both within normal limits. CMP unremarkable.  I discussed these findings with neurosurgery, plastic surgery, and trauma surgery who are agreeable to evaluate the patient in the ED.  Neurosurgery recommended repeat head imaging given his anticoagulated status, repeat head CT ordered for 4 AM.  Trauma surgery will plan for admission for further management.  Plan of care was discussed with the patient and wife at bedside who both verbalized understanding.  Laceration was also repaired as noted in the procedure note above.  At this time, patient is stable for transfer to the trauma floor.  Final Clinical Impression(s) / ED Diagnoses Final diagnoses:  Open fracture of frontal bone, initial encounter ([place])  Facial laceration, initial encounter  Other closed nondisplaced fracture of sixth cervical vertebra, initial encounter [facility])    Rx / [facility] Orders ED Discharge Orders     None         Taksh Hjort, [place], MD 01/04/22 4128    Blanchie Dessert, MD 01/04/22 [phone]

## 2022-01-03 NOTE — ED Provider Notes (Incomplete)
Patient presenting today as a level 2 trauma is a 76 year old male who was playing with his grandkids today and tripped and fell hitting his head on a bird bath with loss of consciousness and takes Eliquis.  Patient is awake and alert at this time but does have repetitive questioning.  He shaped laceration to the upper forehead.  Also evidence of facial trauma.  Imaging to rule out intracranial hemorrhage and facial fracture pending.  Wound repaired as above.

## 2022-01-03 NOTE — Progress Notes (Signed)
Orthopedic Tech Progress Note Patient Details:  JAKAVION BILODEAU 1946-02-13 [id number]  Level 2 trauma  Patient ID: CHADRICK SPRINKLE, male   DOB: 06/28/1946, 76 y.o.   MRN: [id number]  Carin Primrose 01/03/2022, 8:20 PM

## 2022-01-03 NOTE — ED Notes (Signed)
Pt returned from ct

## 2022-01-03 NOTE — ED Notes (Signed)
Pt has 5cm starshaped lac on the left front side of head. Some bone exposure.

## 2022-01-03 NOTE — ED Triage Notes (Signed)
Pt bib [facility] from home for mechanical fall. Pt was running around outside with grandkids tripped and fell and landed head on a concrete birdbath. Pt family reports 1-5 mins of LOC. Pt c/o dizziness, and repetitive questioning.  Pt has 1-2in tall head lack, 0.5 inch wide. Pt on xarelto for previous DVT's.

## 2022-01-04 ENCOUNTER — Inpatient Hospital Stay (HOSPITAL_COMMUNITY): Payer: Medicare PPO

## 2022-01-04 DIAGNOSIS — S0240DA Maxillary fracture, left side, initial encounter for closed fracture: Secondary | ICD-10-CM | POA: Diagnosis not present

## 2022-01-04 DIAGNOSIS — S020XXA Fracture of vault of skull, initial encounter for closed fracture: Secondary | ICD-10-CM | POA: Diagnosis present

## 2022-01-04 DIAGNOSIS — S12590A Other displaced fracture of sixth cervical vertebra, initial encounter for closed fracture: Secondary | ICD-10-CM | POA: Diagnosis not present

## 2022-01-04 DIAGNOSIS — S0003XA Contusion of scalp, initial encounter: Secondary | ICD-10-CM | POA: Diagnosis not present

## 2022-01-04 DIAGNOSIS — S0281XA Fracture of other specified skull and facial bones, right side, initial encounter for closed fracture: Secondary | ICD-10-CM | POA: Diagnosis not present

## 2022-01-04 LAB — HEMOGLOBIN A1C
Hgb A1c MFr Bld: 7.7 % — ABNORMAL HIGH (ref 4.8–5.6)
Mean Plasma Glucose: 174.29 mg/dL

## 2022-01-04 LAB — CBG MONITORING, ED
Glucose-Capillary: 187 mg/dL — ABNORMAL HIGH (ref 70–99)
Glucose-Capillary: 185 mg/dL — ABNORMAL HIGH (ref 70–99)
Glucose-Capillary: 160 mg/dL — ABNORMAL HIGH (ref 70–99)
Glucose-Capillary: 200 mg/dL — ABNORMAL HIGH (ref 70–99)
Glucose-Capillary: 171 mg/dL — ABNORMAL HIGH (ref 70–99)

## 2022-01-04 LAB — URINALYSIS, ROUTINE W REFLEX MICROSCOPIC
Bacteria, UA: NONE SEEN
Bilirubin Urine: NEGATIVE
Glucose, UA: >=500 mg/dL — AB
Hgb urine dipstick: NEGATIVE
Ketones, ur: 20 mg/dL — AB
Leukocytes,Ua: NEGATIVE
Nitrite: NEGATIVE
Protein, ur: NEGATIVE mg/dL
Specific Gravity, Urine: 1.022 (ref 1.005–1.030)
pH: 5 (ref 5.0–8.0)

## 2022-01-04 LAB — BASIC METABOLIC PANEL
Anion gap: 6 (ref 5–15)
BUN: 17 mg/dL (ref 8–23)
CO2: 22 mmol/L (ref 22–32)
Calcium: 8.8 mg/dL — ABNORMAL LOW (ref 8.9–10.3)
Chloride: 107 mmol/L (ref 98–111)
Creatinine, Ser: 0.99 mg/dL (ref 0.61–1.24)
GFR, Estimated: >60 mL/min (ref >60)
Glucose, Bld: 174 mg/dL — ABNORMAL HIGH (ref 70–99)
Potassium: 4 mmol/L (ref 3.5–5.1)
Sodium: 135 mmol/L (ref 135–145)

## 2022-01-04 LAB — CBC
HCT: 42.6 % (ref 39.0–52.0)
Hemoglobin: 14.8 g/dL (ref 13.0–17.0)
MCH: 33.6 pg (ref 26.0–34.0)
MCHC: 34.7 g/dL (ref 30.0–36.0)
MCV: 96.6 fL (ref 80.0–100.0)
Platelets: 184 10*3/uL (ref 150–400)
RBC: 4.41 MIL/uL (ref 4.22–5.81)
RDW: 12.1 % (ref 11.5–15.5)
WBC: 10 10*3/uL (ref 4.0–10.5)
nRBC: 0 % (ref 0.0–0.2)

## 2022-01-04 LAB — ABO/RH: ABO/RH(D): A POS

## 2022-01-04 MED ORDER — OXYCODONE HCL 5 MG PO TABS
5.0000 mg | ORAL_TABLET | ORAL | Status: DC | PRN
  Administered 2022-01-04: 5 mg via ORAL
  Filled 2022-01-04: qty 1

## 2022-01-04 MED ORDER — FENTANYL CITRATE PF 50 MCG/ML IJ SOSY
50.0000 ug | PREFILLED_SYRINGE | Freq: Once | INTRAMUSCULAR | Status: AC
  Administered 2022-01-04: 50 ug via INTRAVENOUS
  Filled 2022-01-04: qty 1

## 2022-01-04 MED ORDER — HYDRALAZINE HCL 20 MG/ML IJ SOLN: 10.0000 mg | INTRAMUSCULAR | Status: DC | PRN

## 2022-01-04 MED ORDER — METHOCARBAMOL 500 MG PO TABS
1000.0000 mg | ORAL_TABLET | Freq: Three times a day (TID) | ORAL | Status: DC
  Administered 2022-01-04: 1000 mg via ORAL
  Filled 2022-01-04: qty 2

## 2022-01-04 MED ORDER — ONDANSETRON HCL 4 MG/2ML IJ SOLN
4.0000 mg | Freq: Four times a day (QID) | INTRAMUSCULAR | Status: DC | PRN
  Administered 2022-01-04: 4 mg via INTRAVENOUS
  Filled 2022-01-04: qty 2

## 2022-01-04 MED ORDER — METHOCARBAMOL 500 MG PO TABS: 500.0000 mg | ORAL_TABLET | Freq: Four times a day (QID) | ORAL | 0 refills | Status: DC | PRN

## 2022-01-04 MED ORDER — INSULIN ASPART 100 UNIT/ML IJ SOLN
0.0000 [IU] | INTRAMUSCULAR | Status: DC
  Administered 2022-01-04 (×5): 3 [IU] via SUBCUTANEOUS

## 2022-01-04 MED ORDER — IBUPROFEN 600 MG PO TABS: 600.0000 mg | ORAL_TABLET | Freq: Four times a day (QID) | ORAL | 0 refills | Status: DC | PRN

## 2022-01-04 MED ORDER — OXYCODONE HCL 5 MG PO TABS: 5.0000 mg | ORAL_TABLET | Freq: Four times a day (QID) | ORAL | 0 refills | Status: DC | PRN

## 2022-01-04 MED ORDER — DOCUSATE SODIUM 100 MG PO CAPS: 100.0000 mg | ORAL_CAPSULE | Freq: Two times a day (BID) | ORAL | Status: DC

## 2022-01-04 MED ORDER — ACETAMINOPHEN 500 MG PO TABS: 1000.0000 mg | ORAL_TABLET | Freq: Four times a day (QID) | ORAL | 0 refills | Status: DC | PRN

## 2022-01-04 MED ORDER — METOPROLOL TARTRATE 5 MG/5ML IV SOLN: 5.0000 mg | Freq: Four times a day (QID) | INTRAVENOUS | Status: DC | PRN

## 2022-01-04 MED ORDER — POLYETHYLENE GLYCOL 3350 17 G PO PACK: 17.0000 g | PACK | Freq: Every day | ORAL | 0 refills | Status: DC | PRN

## 2022-01-04 MED ORDER — ACETAMINOPHEN 500 MG PO TABS
1000.0000 mg | ORAL_TABLET | Freq: Four times a day (QID) | ORAL | Status: DC
  Administered 2022-01-04: 1000 mg via ORAL
  Filled 2022-01-04: qty 2

## 2022-01-04 MED ORDER — MORPHINE SULFATE (PF) 2 MG/ML IV SOLN: 2.0000 mg | INTRAVENOUS | Status: DC | PRN

## 2022-01-04 MED ORDER — ACETAMINOPHEN 325 MG PO TABS: 650.0000 mg | ORAL_TABLET | ORAL | Status: DC | PRN

## 2022-01-04 MED ORDER — ONDANSETRON 4 MG PO TBDP: 4.0000 mg | ORAL_TABLET | Freq: Four times a day (QID) | ORAL | Status: DC | PRN

## 2022-01-04 MED ORDER — IBUPROFEN 400 MG PO TABS
600.0000 mg | ORAL_TABLET | Freq: Four times a day (QID) | ORAL | Status: DC
  Administered 2022-01-04: 600 mg via ORAL
  Filled 2022-01-04: qty 1

## 2022-01-04 NOTE — Evaluation (Addendum)
Physical Therapy Evaluation Patient Details Name: [name] MRN: [id number] DOB: 01/30/1946 Today's Date: 01/04/2022  History of Present Illness  Pt is a 76 y/o male admitted after fall with LOC. Found to have L frontal bone fx, L maxillary sinus fx and C6 endplate fx. PMH includes DVT and skin cancer.  Clinical Impression  Pt admitted secondary to problem above with deficits below. Pt requiring min guard A for mobility tasks secondary to mild unsteadiness. Pt with impaired memory and decreased safety awareness. OT reviewed concussion handout with pt and called family to discussed safety with mobility/ADL tasks at d/c. Reviewed cervical precautions and how to don/doff cervical collar. Recommending outpatient PT to address current mobility deficits. Will continue to follow acutely.        Recommendations for follow up therapy are one component of a multi-disciplinary discharge planning process, led by the attending physician.  Recommendations may be updated based on patient status, additional functional criteria and insurance authorization.  Follow Up Recommendations Outpatient PT    Assistance Recommended at Discharge Frequent or constant Supervision/Assistance (initially)  Patient can return home with the following  Assistance with cooking/housework;Help with stairs or ramp for entrance;Assist for transportation;Direct supervision/assist for financial management;Direct supervision/assist for medications management    Equipment Recommendations None recommended by PT  Recommendations for Other Services       Functional Status Assessment Patient has had a recent decline in their functional status and demonstrates the ability to make significant improvements in function in a reasonable and predictable amount of time.     Precautions / Restrictions Precautions Precautions: Cervical Precaution Booklet Issued: Yes (comment) Precaution Comments: Reviewed cervical precautions with pt and  how to don/doff cervical collar Required Braces or Orthoses: Cervical Brace Cervical Brace: At all times Restrictions Weight Bearing Restrictions: No      Mobility  Bed Mobility Overal bed mobility: Needs Assistance Bed Mobility: Rolling, Sidelying to Sit, Sit to Sidelying Rolling: Supervision Sidelying to sit: Supervision     Sit to sidelying: Supervision General bed mobility comments: Cues for log roll technique    Transfers Overall transfer level: Needs assistance Equipment used: None Transfers: Sit to/from Stand Sit to Stand: Min guard           General transfer comment: Min guard for safety    Ambulation/Gait Ambulation/Gait assistance: Min guard Gait Distance (Feet): 300 Feet Assistive device: None Gait Pattern/deviations: Step-through pattern, Decreased stride length Gait velocity: Decreased     General Gait Details: Mild unsteadiness noted, especially when turning. Min guard A for safety throughout. Educated about walking program to perform at home  Stairs            Wheelchair Mobility    Modified Rankin (Stroke Patients Only)       Balance Overall balance assessment: Mild deficits observed, not formally tested                                           Pertinent Vitals/Pain      Home Living Family/patient expects to be discharged to:: Private residence Living Arrangements: Spouse/significant other Available Help at Discharge: Family Type of Home: House Home Access: Stairs to enter Entrance Stairs-Rails: Right Entrance Stairs-Number of Steps: 3 Alternate Level Stairs-Number of Steps: flight Home Layout: Two level Home Equipment: None Additional Comments: Pt reporting he lived in one story home, but according to conversation with wife, lives  in 2 story home    Prior Function Prior Level of Function : 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; retired)                     Electronics engineer   Dominant Hand: Right    Extremity/Trunk Assessment   Upper Extremity Assessment Upper Extremity Assessment: Defer to OT evaluation    Lower Extremity Assessment Lower Extremity Assessment: Generalized weakness    Cervical / Trunk Assessment Cervical / Trunk Assessment: Other exceptions Cervical / Trunk Exceptions: C6 fx  Communication      Cognition Arousal/Alertness: Awake/alert Behavior During Therapy: WFL for tasks assessed/performed Overall Cognitive Status: No family/caregiver present to determine baseline cognitive functioning                                 General Comments: Pt with memory deficits noted and decreased awareness of safety. Had to repeat instructions for how to don/doff brace after finding pt with brace off while seated in chair.        General Comments      Exercises     Assessment/Plan    PT Assessment Patient needs continued PT services  PT Problem List Decreased strength;Decreased activity tolerance;Decreased balance;Decreased mobility;Decreased cognition;Decreased safety awareness;Decreased knowledge of precautions;Pain       PT Treatment Interventions Gait training;Stair training;Functional mobility training;Therapeutic activities;Therapeutic exercise;Balance training;Patient/family education    PT Goals (Current goals can be found in the Care Plan section)  Acute Rehab PT Goals Patient Stated Goal: to go home PT Goal Formulation: With patient Time For Goal Achievement: 01/18/22 Potential to Achieve Goals: Good    Frequency Min 3X/week     Co-evaluation PT/OT/SLP Co-Evaluation/Treatment: Yes Reason for Co-Treatment: For patient/therapist safety;To address functional/ADL transfers PT goals addressed during session: Mobility/safety with mobility;Balance         AM-PAC PT "6 Clicks" Mobility  Outcome Measure Help needed turning from your back to your side while in a flat bed without using bedrails?: A  Little Help needed moving from lying on your back to sitting on the side of a flat bed without using bedrails?: A Little Help needed moving to and from a bed to a chair (including a wheelchair)?: A Little Help needed standing up from a chair using your arms (e.g., wheelchair or bedside chair)?: A Little Help needed to walk in hospital room?: A Little Help needed climbing 3-5 steps with a railing? : A Little 6 Click Score: 18    End of Session Equipment Utilized During Treatment: Gait belt;Cervical collar Activity Tolerance: Patient tolerated treatment well Patient left: in chair;with call bell/phone within reach (in recliner in ED) Nurse Communication: Mobility status PT Visit Diagnosis: Unsteadiness on feet (R26.81);Muscle weakness (generalized) (M62.81);Other symptoms and signs involving the nervous system (B28.413)    Time: 2440-1027 PT Time Calculation (min) (ACUTE ONLY): 35 min   Charges:   PT Evaluation $PT Eval Moderate Complexity: 1 Mod          Reuel Derby, PT, DPT  Acute Rehabilitation Services  Office: [phone]   Rudean Hitt 01/04/2022, 4:17 PM

## 2022-01-04 NOTE — H&P (Signed)
Activation and Reason: consult, falll  [name] is an 76 y.o. male.  HPI: He was chasing his grand kids and tripped and fell onto a concrete bird bath. He complains of pain in his head and neck. He denies numbness, tingling, or weakness. He takes xarelto for PE and last dose was 6/7.  Past Medical History:  Diagnosis Date   Hyperlipidemia    Low testosterone    Prediabetes    "prediabetic; lost weight; increased activity; now blood sugar is fine" (08/16/2015   Pulmonary embolism ([place]) 08/15/2015   "bilateral large clots"   Rosacea    Skin cancer    "left groin area"   Walking pneumonia 1990s X 1    Past Surgical History:  Procedure Laterality Date   APPENDECTOMY  1967   CHOLECYSTECTOMY OPEN  ~ 2006   ORIF TIBIA & FIBULA FRACTURES Right 2005   "has a rod in it now"   TONSILLECTOMY  1950s    Family History  Problem Relation Age of Onset   Heart attack Father        Early onset, mid-30s   Cancer Mother        Esophagus or stomach    Social History:  reports that he has quit smoking. His smoking use included cigarettes. He has never used smokeless tobacco. He reports current alcohol use of about 2.0 standard drinks of alcohol per week. He reports current drug use. Drug: Marijuana.  Allergies: No Known Allergies  Medications: I have reviewed the patient's current medications.  Results for orders placed or performed during the hospital encounter of 01/03/22 (from the past 48 hour(s))  Comprehensive metabolic panel     Status: Abnormal   Collection Time: 01/03/22  8:07 PM  Result Value Ref Range   Sodium 137 135 - 145 mmol/L   Potassium 3.6 3.5 - 5.1 mmol/L   Chloride 106 98 - 111 mmol/L   CO2 22 22 - 32 mmol/L   Glucose, Bld 164 (H) 70 - 99 mg/dL    Comment: Glucose reference range applies only to samples taken after fasting for at least 8 hours.   BUN 17 8 - 23 mg/dL   Creatinine, Ser 1.01 0.61 - 1.24 mg/dL   Calcium 8.4 (L) 8.9 - 10.3 mg/dL   Total Protein  6.1 (L) 6.5 - 8.1 g/dL   Albumin 3.9 3.5 - 5.0 g/dL   AST 21 15 - 41 U/L   ALT 18 0 - 44 U/L   Alkaline Phosphatase 40 38 - 126 U/L   Total Bilirubin 0.9 0.3 - 1.2 mg/dL   GFR, Estimated >60 >60 mL/min    Comment: (NOTE) Calculated using the CKD-EPI Creatinine Equation (2021)    Anion gap 9 5 - 15    Comment: Performed at [facility], [place] [address] [place], [place] [address]  CBC     Status: Abnormal   Collection Time: 01/03/22  8:07 PM  Result Value Ref Range   WBC 7.8 4.0 - 10.5 K/uL   RBC 4.10 (L) 4.22 - 5.81 MIL/uL   Hemoglobin 14.4 13.0 - 17.0 g/dL   HCT 40.4 39.0 - 52.0 %   MCV 98.5 80.0 - 100.0 fL   MCH 35.1 (H) 26.0 - 34.0 pg   MCHC 35.6 30.0 - 36.0 g/dL   RDW 12.2 11.5 - 15.5 %   Platelets 165 150 - 400 K/uL   nRBC 0.0 0.0 - 0.2 %    Comment: Performed at [facility], [place] [address], [place], [place] [address]  Type and screen [place]     Status: None   Collection Time: 01/03/22  8:07 PM  Result Value Ref Range   ABO/RH(D) A POS    Antibody Screen NEG    Sample Expiration      01/06/2022,2359 Performed at [facility], [place] [address], [place], [place] [address]   Urinalysis, Routine w reflex microscopic     Status: Abnormal   Collection Time: 01/03/22 11:56 PM  Result Value Ref Range   Color, Urine YELLOW YELLOW   APPearance CLEAR CLEAR   Specific Gravity, Urine 1.022 1.005 - 1.030   pH 5.0 5.0 - 8.0   Glucose, UA >=500 (A) NEGATIVE mg/dL   Hgb urine dipstick NEGATIVE NEGATIVE   Bilirubin Urine NEGATIVE NEGATIVE   Ketones, ur 20 (A) NEGATIVE mg/dL   Protein, ur NEGATIVE NEGATIVE mg/dL   Nitrite NEGATIVE NEGATIVE   Leukocytes,Ua NEGATIVE NEGATIVE   RBC / HPF 0-5 0 - 5 RBC/hpf   WBC, UA 0-5 0 - 5 WBC/hpf   Bacteria, UA NONE SEEN NONE SEEN   Squamous Epithelial / LPF 0-5 0 - 5    Comment: Performed at [facility], [place] [address] [place], [place] [address]    DG Chest Port 1 View  Result Date:  01/03/2022 CLINICAL DATA:  Trauma. EXAM: PORTABLE CHEST 1 VIEW COMPARISON:  Chest x-ray 08/15/2015. FINDINGS: The heart size and mediastinal contours are within normal limits. Both lungs are clear. The visualized skeletal structures are unremarkable. IMPRESSION: No active disease. Electronically Signed   By: Ronney Asters M.D.   On: 01/03/2022 20:26    Review of Systems  Constitutional: Negative.   HENT: Negative.    Eyes: Negative.   Respiratory: Negative.    Cardiovascular: Negative.   Gastrointestinal: Negative.   Genitourinary: Negative.   Musculoskeletal: Negative.   Skin: Negative.   Neurological:  Positive for headaches.  Endo/Heme/Allergies: Negative.   Psychiatric/Behavioral: Negative.      PE Blood pressure (!) 154/68, pulse 77, temperature 97.6 F (36.4 C), temperature source Oral, resp. rate 16, height '6\' 1"'$  (1.854 m), weight 88.5 kg, SpO2 94 %. Constitutional: NAD; conversant; laceration to anterior scalp deformities Eyes: Moist conjunctiva; no lid lag; anicteric; PERRL Neck: Trachea midline; no thyromegaly, collar in place Lungs: Normal respiratory effort; no tactile fremitus CV: RRR; no palpable thrills; no pitting edema GI: Abd soft, NT, ND; no palpable hepatosplenomegaly MSK: moves all extremities with good strength, unable to assess gait; no clubbing/cyanosis Psychiatric: Appropriate affect; alert and oriented x3 Lymphatic: No palpable cervical or axillary lymphadenopathy   Assessment/Plan: 76 yo male with fall  L Frontal skull fracture - consult NSGY L maxillary sinus fractures - consult ENT C6 superior endplate fx - collar, consult NSGY  FEN- NPO VTE- scds only ID- no issues Dispo- admit to progressive care  I reviewed last 24 h vitals and pain scores, last 48 h intake and output, last 24 h labs and trends, and last 24 h imaging results.  This care required high  level of medical decision making.   Arta Bruce [name] 01/04/2022, 1:06 AM

## 2022-01-04 NOTE — TOC CAGE-AID Note (Signed)
Transition of Care [facility]) - CAGE-AID Screening   Patient Details  Name: [name] MRN: [id number] Date of Birth: 1945/12/29  Transition of Care [facility]) CM/SW Contact:    Gaetano Hawthorne Tarpley-Carter, [place] Phone Number: 01/04/2022, 9:21 AM   Clinical Narrative: Pt participated in [place].  Pt stated he does use substance and ETOH.  Pt was offered resources, due to usage of substance and ETOH.    Ryen Heitmeyer Tarpley-Carter, MSW, LCSW-A Pronouns:  She/Her/Hers [facility] Clinical Social Worker Direct Number:  [phone] [email or web address]  CAGE-AID Screening:    Have You Ever Felt You Ought to Cut Down on Your Drinking or Drug Use?: No Have People Annoyed You By [employer] Your Drinking Or Drug Use?: No Have You Felt Bad Or Guilty About Your Drinking Or Drug Use?: No Have You Ever Had a Drink or Used Drugs First Thing In The Morning to Steady Your Nerves or to Get Rid of a Hangover?: No CAGE-AID Score: 0     Substance abuse interventions: Scientist, clinical (histocompatibility and immunogenetics)

## 2022-01-04 NOTE — ED Notes (Signed)
Family at bedside. 

## 2022-01-04 NOTE — Care Management Obs Status (Signed)
[place] NOTIFICATION   Patient Details  Name: [name] MRN: [id number] Date of Birth: [birth year]   Medicare Observation Status Notification Given:  Yes    Verdell Carmine, RN 01/04/2022, 4:52 PM

## 2022-01-04 NOTE — ED Notes (Signed)
Patient is resting comfortably. 

## 2022-01-04 NOTE — TOC Transition Note (Signed)
Transition of Care [facility]) - CM/SW Discharge Note   Patient Details  Name: [name] MRN: [id number] Date of Birth: 01-01-1946  Transition of [facility]) CM/SW Contact:  Ella Bodo, RN Phone Number: 01/04/2022, 4:24 PM   Clinical Narrative:    Pt is a 76 y/o male admitted after fall with LOC. Found to have L frontal bone fx, L maxillary sinus fx and C6 endplate fx.  PTA, patient independent and living at home with spouse.  PT/OT recommending outpatient therapies; referrals made to [facility] for follow-up.   Final next level of care: OP Rehab Barriers to Discharge: Barriers Resolved                       Discharge Plan and Services   Discharge Planning Services: CM Consult                                 Social Determinants of Health (SDOH) Interventions     Readmission Risk Interventions     No data to display          Reinaldo Raddle, RN, BSN  Trauma/Neuro ICU Case Manager [phone]

## 2022-01-04 NOTE — Progress Notes (Signed)
Patient seen and examined. Pain controlled. Enthusiastic to go home. Reports being seen by ENT, but no note visible. Does not recall seeing NSGY. Awaiting PT/OT evals. If consults complete and cleared by all services, potentially may be a candidate for discharge today. Works as a Pharmacist, hospital at [employer], lives with wife and one of his adult children is home for the a few days.   Jesusita Oka, MD General and [place] [facility]

## 2022-01-04 NOTE — Evaluation (Signed)
Occupational Therapy Evaluation Patient Details Name: [name] MRN: [id number] DOB: 19-Feb-1946 Today's Date: 01/04/2022   History of Present Illness Pt is a 76 y/o male admitted after fall with LOC. Found to have L frontal bone fx, L maxillary sinus fx and C6 endplate fx. PMH includes DVT and skin cancer.   Clinical Impression   PT admitted with LOC with L frontal bone fx L maxillary sinus fx and C6 fx. Pt provided education on concussion for [facility] TBI. Pt reports no memory of fall or events directly afterward becoming teary eyed. Pt progressed with therapy this session for bed mobility , basic transfer and cervical education. Pt with poor recall of cervical safety and should have direct supervision over the weekend. Wife and daughter were called provided education and notified written education is being left in the room.  Pt currently with functional limitiations due to the deficits listed below (see OT problem list).  Pt will benefit from skilled OT to increase their independence and safety with adls and balance to allow discharge outpatient concussion follow up and balance needs.       Recommendations for follow up therapy are one component of a multi-disciplinary discharge planning process, led by the attending physician.  Recommendations may be updated based on patient status, additional functional criteria and insurance authorization.   Follow Up Recommendations  Outpatient OT    Assistance Recommended at Discharge Intermittent Supervision/Assistance  Patient can return home with the following A little help with walking and/or transfers;A little help with bathing/dressing/bathroom;Assistance with cooking/housework;Assist for transportation    Functional Status Assessment  Patient has had a recent decline in their functional status and demonstrates the ability to make significant improvements in function in a reasonable and predictable amount of time.  Equipment Recommendations   None recommended by OT    Recommendations for Other Services       Precautions / Restrictions Precautions Precautions: Cervical Precaution Booklet Issued: Yes (comment) Precaution Comments: Reviewed cervical precautions with pt and how to don/doff cervical collar Required Braces or Orthoses: Cervical Brace Cervical Brace: At all times Restrictions Weight Bearing Restrictions: No      Mobility Bed Mobility Overal bed mobility: Needs Assistance Bed Mobility: Rolling, Sidelying to Sit, Sit to Sidelying Rolling: Supervision Sidelying to sit: Supervision     Sit to sidelying: Supervision General bed mobility comments: Cues for log roll technique    Transfers Overall transfer level: Needs assistance Equipment used: None Transfers: Sit to/from Stand Sit to Stand: Min guard           General transfer comment: Min guard for safety      Balance Overall balance assessment: Mild deficits observed, not formally tested                                         ADL either performed or assessed with clinical judgement   ADL Overall ADL's : Needs assistance/impaired Eating/Feeding: Modified independent;Sitting   Grooming: Supervision/safety;Standing   Upper Body Bathing: Sitting;Supervision/ safety   Lower Body Bathing: Supervison/ safety   Upper Body Dressing : Minimal assistance;Sitting;Bed level Upper Body Dressing Details (indicate cue type and reason): pt will require (A) with aspen ccoollar pt educated multiple time to only remove ccollar with family and supine. pt found after sesson with brace off. Lower Body Dressing: Min guard;Sit to/from stand Lower Body Dressing Details (indicate cue type and reason): able  to figure 4 cross to don socks Toilet Transfer: Supervision/safety       Tub/ Shower Transfer: English as a second language teacher Details (indicate cue type and reason): spoke to wife via phone and reports not allowing him to shower  without supervision Functional mobility during ADLs: Supervision/safety General ADL Comments: pt provided handouts and education on cervical/ concussion at this time. wife and daughter called and educated on information. wife asking to have staff demonstrate ccollar. RN notified and will demonstrate with family present.     Vision Baseline Vision/History: 1 Wears glasses Ability to See in Adequate Light: 0 Adequate Additional Comments: reading glasses     Perception     Praxis      Pertinent Vitals/Pain Pain Assessment Pain Assessment: No/denies pain     Hand Dominance Right   Extremity/Trunk Assessment Upper Extremity Assessment Upper Extremity Assessment: Overall WFL for tasks assessed (denies any changes)   Lower Extremity Assessment Lower Extremity Assessment: Defer to PT evaluation   Cervical / Trunk Assessment Cervical / Trunk Assessment: Other exceptions Cervical / Trunk Exceptions: C6 fx   Communication Communication Communication: No difficulties   Cognition Arousal/Alertness: Awake/alert Behavior During Therapy: WFL for tasks assessed/performed Overall Cognitive Status: No family/caregiver present to determine baseline cognitive functioning                                 General Comments: Pt with memory deficits noted and decreased awareness of safety. Had to repeat instructions for how to don/doff brace after finding pt with brace off while seated in chair.     General Comments  wound on head dry and sutures intact. Aspen collar replaced in good alignment after session    Exercises     Shoulder Instructions      Home Living Family/patient expects to be discharged to:: Private residence Living Arrangements: Spouse/significant other Available Help at Discharge: Family Type of Home: House Home Access: Stairs to enter Technical brewer of Steps: 3 Entrance Stairs-Rails: Right Home Layout: Two level Alternate Level Stairs-Number of  Steps: flight Alternate Level Stairs-Rails: Right Bathroom Shower/Tub: Occupational psychologist: Standard     Home Equipment: None   Additional Comments: Pt reporting he lived in one story home, but according to conversation with wife, lives in 2 story home      Prior Functioning/Environment Prior Level of Function : 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; retired)                        OT Problem List: Impaired balance (sitting and/or standing);Decreased cognition;Decreased safety awareness;Decreased knowledge of use of DME or AE;Decreased knowledge of precautions      OT Treatment/Interventions: Self-care/ADL training;Therapeutic exercise;DME and/or AE instruction;Manual therapy;Modalities;Therapeutic activities;Cognitive remediation/compensation;Patient/family education;Balance training    OT Goals(Current goals can be found in the care plan section) Acute Rehab OT Goals Patient Stated Goal: to brace putting brace on and off OT Goal Formulation: With patient/family Time For Goal Achievement: 01/18/22 Potential to Achieve Goals: Good  OT Frequency: Min 2X/week    Co-evaluation PT/OT/SLP Co-Evaluation/Treatment: Yes Reason for Co-Treatment: Necessary to address cognition/behavior during functional activity;For patient/therapist safety PT goals addressed during session: Mobility/safety with mobility;Balance OT goals addressed during session: ADL's and self-care;Proper use of Adaptive equipment and DME;Strengthening/ROM      AM-PAC OT "6 Clicks" Daily Activity     Outcome Measure Help from another person eating meals?: None Help from  another person taking care of personal grooming?: None Help from another person toileting, which includes using toliet, bedpan, or urinal?: A Little Help from another person bathing (including washing, rinsing, drying)?: A Little Help from another person to put on and taking off regular upper body  clothing?: A Little Help from another person to put on and taking off regular lower body clothing?: A Little 6 Click Score: 20   End of Session Equipment Utilized During Treatment: Cervical collar Nurse Communication: Mobility status;Precautions  Activity Tolerance: Patient tolerated treatment well Patient left: in chair;with call bell/phone within reach  OT Visit Diagnosis: Unsteadiness on feet (R26.81);Muscle weakness (generalized) (M62.81)                Time: 1607-3710 OT Time Calculation (min): 35 min Charges:  OT General Charges $OT Visit: 1 Visit OT Evaluation $OT Eval Moderate Complexity: 1 Mod   Brynn, OTR/L  Acute Rehabilitation Services Office: [phone] .   Jeri Modena 01/04/2022, 4:45 PM

## 2022-01-04 NOTE — Discharge Summary (Incomplete)
[facility] Discharge Summary   Patient ID: [name] MRN: [id number] DOB/AGE: 1946/01/19 76 y.o.  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 EXAM: CT HEAD WITHOUT CONTRAST TECHNIQUE: Contiguous axial images were obtained from the base of the skull through the vertex without intravenous contrast. RADIATION DOSE REDUCTION: This exam was performed according to the departmental dose-optimization program which includes automated exposure control, adjustment of the mA and/or kV according to patient size and/or use of iterative reconstruction technique. COMPARISON:  01/03/2022. FINDINGS: Brain: No acute intracranial hemorrhage, midline shift or mass effect. No extra-axial fluid collection. Diffuse atrophy is noted. Mild periventricular white matter hypodensities are present bilaterally. No hydrocephalus. Vascular: No hyperdense vessel or unexpected calcification. Skull: There is redemonstration of a fracture through the frontal bone on the left and left frontal sinus. Sinuses/Orbits: There is a fracture through the frontal sinus on the left. Air-fluid levels are noted in the left frontal sinus. There is partial opacification of the ethmoid air cells bilaterally and left maxillary sinus. Mucosal thickening is noted in the sphenoid sinuses and right maxillary sinus. Small amount of retrobulbar fat stranding is noted on the left, slightly increased from the prior exam. There is mild proptosis on the left. Other: Small scalp hematoma containing air anterior to  the frontal bone on the left. Soft tissue swelling is present over the left orbit. Fracture of the anterior and lateral walls of the maxillary sinus on the left, unchanged. IMPRESSION: 1. No acute intracranial hemorrhage. 2. Atrophy with chronic microvascular ischemic changes. 3. Left frontal bone fracture traversing the left frontal sinus and maxillary sinus on the left are unchanged. 4. Slightly increased retrobulbar fat stranding on the left as compared with the previous exam with mild proptosis. Electronically Signed   By: Brett Fairy M.D.   On: 01/04/2022 04:40   CT HEAD WO CONTRAST  Result Date: 01/03/2022 CLINICAL DATA:  Fall EXAM: CT HEAD WITHOUT CONTRAST CT MAXILLOFACIAL WITHOUT CONTRAST CT CERVICAL SPINE WITHOUT CONTRAST TECHNIQUE: Multidetector CT imaging of the head, cervical spine, and maxillofacial structures were performed using the standard protocol without intravenous contrast. Multiplanar CT image reconstructions of the cervical spine and maxillofacial structures were also generated. RADIATION DOSE REDUCTION: This exam was performed according to the departmental dose-optimization program which includes automated exposure control, adjustment of the mA and/or kV according to patient size and/or use of iterative reconstruction technique. COMPARISON:  None Available. FINDINGS: CT HEAD FINDINGS Brain: There is no mass, hemorrhage or extra-axial collection. There is generalized atrophy without lobar predilection. There is hypoattenuation of the periventricular white matter, most commonly indicating chronic ischemic microangiopathy. Vascular: No abnormal hyperdensity of the major intracranial arteries or dural venous sinuses. No intracranial atherosclerosis. Skull: Left frontal bone fracture traverses the anterior and posterior table of the left frontal sinus. CT MAXILLOFACIAL FINDINGS Osseous: Left frontal fracture extends through the anterior and posterior walls of the left frontal sinus and  inferiorly through the left ethmoid sinus and the roof of the left orbit. There are nondisplaced fractures of the anterior and lateral walls of the left maxillary sinus. Orbits:  Minimal edema within the medial extraconal soft tissues of the left orbit. Otherwise normal orbital soft tissues. Sinuses: Partial opacification of the left frontal, ethmoid and maxillary sinuses with blood. Soft tissues: Left supraorbital soft tissue swelling. CT CERVICAL SPINE FINDINGS Alignment: No static subluxation. Facets are aligned. Occipital condyles and the lateral masses of C1-C2 are aligned. Skull base and vertebrae: There is a nondisplaced fracture underlying the superior endplate C6. There is no height loss. The fracture extends in the anterior-posterior direction through the anterior and posterior walls. Soft tissues and spinal canal: No prevertebral fluid or swelling. No visible canal hematoma. Disc levels: No advanced spinal canal or neural foraminal stenosis. Upper chest: No pneumothorax, pulmonary nodule or pleural effusion. Other: Normal visualized paraspinal cervical soft tissues. IMPRESSION: 1. Left frontal bone fracture traverses the anterior and posterior table of the left frontal sinus but there is no visible intracranial hemorrhage. Consider follow-up head CT in 4-6 hours. 2. Nondisplaced fractures of the anterior and lateral walls of the left maxillary sinus and the roof of the left orbit. 3. Nondisplaced acute fracture underlying the superior endplate of C6 without height loss. Fracture involves the anterior and posterior walls and is compatible with a hyperextension type injury. Critical Value/emergent results were called by telephone at the time of interpretation on 01/03/2022 at 10:48 pm to provider [facility] , who verbally acknowledged these results. Electronically Signed   By: Ulyses Jarred M.D.   On: 01/03/2022 22:50   CT MAXILLOFACIAL WO CONTRAST  Result Date: 01/03/2022 CLINICAL DATA:  Fall EXAM:  CT HEAD WITHOUT CONTRAST CT MAXILLOFACIAL WITHOUT CONTRAST CT CERVICAL SPINE WITHOUT CONTRAST TECHNIQUE: Multidetector CT imaging of the head, cervical spine, and maxillofacial structures were performed using the standard protocol without intravenous contrast. Multiplanar CT image reconstructions of the cervical spine and maxillofacial structures were also generated. RADIATION DOSE REDUCTION: This exam was performed according to the departmental dose-optimization program which includes automated exposure control, adjustment of the mA and/or kV according to patient size and/or use of iterative reconstruction technique. COMPARISON:  None Available. FINDINGS: CT HEAD FINDINGS Brain: There is no mass, hemorrhage or extra-axial collection. There is generalized atrophy without lobar predilection. There is hypoattenuation of the periventricular white matter, most commonly indicating chronic ischemic microangiopathy. Vascular: No abnormal hyperdensity of the major intracranial arteries or dural venous sinuses. No intracranial atherosclerosis. Skull: Left frontal bone fracture traverses the anterior and posterior table of the left frontal sinus. CT MAXILLOFACIAL FINDINGS Osseous: Left frontal fracture extends through the anterior and posterior walls of the left frontal sinus and inferiorly through the left ethmoid sinus and the roof of the left orbit. There are nondisplaced fractures of the anterior and lateral walls of the left maxillary sinus. Orbits: Minimal edema within the medial extraconal soft tissues of the left orbit. Otherwise normal orbital soft tissues. Sinuses: Partial opacification of the left frontal, ethmoid and maxillary sinuses with blood. Soft tissues: Left supraorbital soft tissue swelling. CT CERVICAL SPINE FINDINGS Alignment: No static subluxation. Facets are aligned. Occipital condyles and the lateral masses of C1-C2 are aligned. Skull base and vertebrae: There is a nondisplaced fracture underlying the  superior endplate C6. There is no height loss. The fracture extends in the anterior-posterior direction through the anterior and posterior walls. Soft tissues and spinal canal: No prevertebral fluid or swelling. No visible canal hematoma. Disc levels: No advanced spinal canal or neural foraminal stenosis. Upper chest: No pneumothorax, pulmonary nodule or pleural effusion. Other: Normal visualized paraspinal cervical soft tissues. IMPRESSION:  1. Left frontal bone fracture traverses the anterior and posterior table of the left frontal sinus but there is no visible intracranial hemorrhage. Consider follow-up head CT in 4-6 hours. 2. Nondisplaced fractures of the anterior and lateral walls of the left maxillary sinus and the roof of the left orbit. 3. Nondisplaced acute fracture underlying the superior endplate of C6 without height loss. Fracture involves the anterior and posterior walls and is compatible with a hyperextension type injury. Critical Value/emergent results were called by telephone at the time of interpretation on 01/03/2022 at 10:48 pm to provider [facility] , who verbally acknowledged these results. Electronically Signed   By: Ulyses Jarred M.D.   On: 01/03/2022 22:50   CT Cervical Spine Wo Contrast  Result Date: 01/03/2022 CLINICAL DATA:  Fall EXAM: CT HEAD WITHOUT CONTRAST CT MAXILLOFACIAL WITHOUT CONTRAST CT CERVICAL SPINE WITHOUT CONTRAST TECHNIQUE: Multidetector CT imaging of the head, cervical spine, and maxillofacial structures were performed using the standard protocol without intravenous contrast. Multiplanar CT image reconstructions of the cervical spine and maxillofacial structures were also generated. RADIATION DOSE REDUCTION: This exam was performed according to the departmental dose-optimization program which includes automated exposure control, adjustment of the mA and/or kV according to patient size and/or use of iterative reconstruction technique. COMPARISON:  None Available.  FINDINGS: CT HEAD FINDINGS Brain: There is no mass, hemorrhage or extra-axial collection. There is generalized atrophy without lobar predilection. There is hypoattenuation of the periventricular white matter, most commonly indicating chronic ischemic microangiopathy. Vascular: No abnormal hyperdensity of the major intracranial arteries or dural venous sinuses. No intracranial atherosclerosis. Skull: Left frontal bone fracture traverses the anterior and posterior table of the left frontal sinus. CT MAXILLOFACIAL FINDINGS Osseous: Left frontal fracture extends through the anterior and posterior walls of the left frontal sinus and inferiorly through the left ethmoid sinus and the roof of the left orbit. There are nondisplaced fractures of the anterior and lateral walls of the left maxillary sinus. Orbits: Minimal edema within the medial extraconal soft tissues of the left orbit. Otherwise normal orbital soft tissues. Sinuses: Partial opacification of the left frontal, ethmoid and maxillary sinuses with blood. Soft tissues: Left supraorbital soft tissue swelling. CT CERVICAL SPINE FINDINGS Alignment: No static subluxation. Facets are aligned. Occipital condyles and the lateral masses of C1-C2 are aligned. Skull base and vertebrae: There is a nondisplaced fracture underlying the superior endplate C6. There is no height loss. The fracture extends in the anterior-posterior direction through the anterior and posterior walls. Soft tissues and spinal canal: No prevertebral fluid or swelling. No visible canal hematoma. Disc levels: No advanced spinal canal or neural foraminal stenosis. Upper chest: No pneumothorax, pulmonary nodule or pleural effusion. Other: Normal visualized paraspinal cervical soft tissues. IMPRESSION: 1. Left frontal bone fracture traverses the anterior and posterior table of the left frontal sinus but there is no visible intracranial hemorrhage. Consider follow-up head CT in 4-6 hours. 2. Nondisplaced  fractures of the anterior and lateral walls of the left maxillary sinus and the roof of the left orbit. 3. Nondisplaced acute fracture underlying the superior endplate of C6 without height loss. Fracture involves the anterior and posterior walls and is compatible with a hyperextension type injury. Critical Value/emergent results were called by telephone at the time of interpretation on 01/03/2022 at 10:48 pm to provider [facility] , who verbally acknowledged these results. Electronically Signed   By: Ulyses Jarred M.D.   On: 01/03/2022 22:50   DG Chest Port 1 View  Result Date:  01/03/2022 CLINICAL DATA:  Trauma. EXAM: PORTABLE CHEST 1 VIEW COMPARISON:  Chest x-ray 08/15/2015. FINDINGS: The heart size and mediastinal contours are within normal limits. Both lungs are clear. The visualized skeletal structures are unremarkable. IMPRESSION: No active disease. Electronically Signed   By: Ronney Asters M.D.   On: 01/03/2022 20:26    Procedures Dr. Marland Kitchen Marland Kitchen/**/2023) - Laparoscopic Cholecystectomy with IOC Dr. Marland Kitchen Marland Kitchen/**/2023) - Laparoscopic Appendectomy  Hospital Course:  GUILLERMO NEHRING is a 76 y.o. male who presented to [facility] *** with ***.  Workup showed ***.  Patient was admitted and underwent procedure listed above.  Tolerated procedure well and was transferred to the floor.  Diet was advanced as tolerated.  Patient worked with therapies during this admission who recommended *** when medically stable for discharge. On POD***, the patient was voiding well, tolerating diet, ambulating well, pain well controlled, vital signs stable, incisions c/d/i and felt stable for discharge home.  Patient will follow up as below and knows to call with questions or concerns.    ***I have personally reviewed the patients medication history on the [place] controlled substance database.   ***I was not directly involved in this patient's care therefore the information in this discharge summary was taken from the chart.  Physical  Exam: General:  Alert, NAD, pleasant, comfortable Pulm: rate and effort normal Abd:  Soft, ND, appropriately tender, multiple lap incisions C/D/I, drain with minimal serosanguinous drainage ***   Allergies as of 01/04/2022   No Known Allergies      Medication List     STOP taking these medications    Rivaroxaban Stater Pack (15 mg and 20 mg) Commonly known as: Xarelto Starter Pack   Xarelto 20 MG Tabs tablet Generic drug: rivaroxaban       TAKE these medications    acetaminophen 500 MG tablet Commonly known as: TYLENOL Take 2 tablets (1,000 mg total) by mouth every 6 (six) hours as needed for mild pain.   ibuprofen 600 MG tablet Commonly known as: ADVIL Take 1 tablet (600 mg total) by mouth every 6 (six) hours as needed for moderate pain.   Jardiance 25 MG Tabs tablet Generic drug: empagliflozin Take 25 mg by mouth daily.   metFORMIN 500 MG 24 hr tablet Commonly known as: GLUCOPHAGE-XR Take 1,000 mg by mouth 2 (two) times daily.   methocarbamol 500 MG tablet Commonly known as: ROBAXIN Take 1 tablet (500 mg total) by mouth every 6 (six) hours as needed for muscle spasms.   minocycline 100 MG capsule Commonly known as: MINOCIN Take 100 mg by mouth 2 (two) times a week. No set days   oxyCODONE 5 MG immediate release tablet Commonly known as: Oxy IR/ROXICODONE Take 1 tablet (5 mg total) by mouth every 6 (six) hours as needed for severe pain.   polyethylene glycol 17 g packet Commonly known as: MiraLax Take 17 g by mouth daily as needed for mild constipation.   SILDENAFIL CITRATE PO Take 1-3 tablets by mouth daily as needed (ED).   simvastatin 80 MG tablet Commonly known as: ZOCOR Take 80 mg by mouth at bedtime.   valACYclovir 1000 MG tablet Commonly known as: VALTREX TAKE 1 TABLET (1,000 MG TOTAL) BY MOUTH DAILY. What changed:  how much to take when to take this reasons to take this          Follow-up Information     Vallarie Mare, MD.  Schedule an appointment as soon as possible for a visit in 1  week(s).   Specialty: Neurosurgery Why: Call for appointment regarding neck injury Contact information: [place] [address] [phone]         Maury Dus, MD. Call.   Specialty: Family Medicine Why: Call for posthospitalization follow up appointment Contact information: [place] [address] [place]. Call.   Specialty: Rehabilitation Why: Outpatient physical and occupational therapies; please call ASAP to schedule appointments. Contact information: [address] [place] [id number] [place] [id number] [phone]        Lennice Sites, MD. Schedule an appointment as soon as possible for a visit in 1 week(s).   Specialty: Plastic Surgery Why: regarding facial fracture Contact information: [address] [place] [address] [phone]                  Signed: [place], [facility] 01/04/2022, 4:33 PM Please see Amion for pager number during day hours 7:00am-4:30pm

## 2022-01-04 NOTE — Care Management CC44 (Signed)
Condition Code 44 Documentation Completed  Patient Details  Name: ELDRIDGE MARCOTT MRN: [id number] Date of Birth: [birth year]   Condition Code 44 given:  Yes Patient signature on Condition Code 44 notice:  Yes Documentation of 2 MD's agreement:  Yes Code 44 added to claim:  Yes    Verdell Carmine, RN 01/04/2022, 4:52 PM

## 2022-01-04 NOTE — Progress Notes (Signed)
I have reviewed the ct scans of his head and cervical spine.  Patient with an non displaced frontal fracture through the frontal sinus.  No evidence of intracranial injury or evidence of pneumocephalus.  No treatment should be necessary for this.  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 Linear distraction type injury through the body of C6 without displacement.  Posterior elements are intact and alignment is anatomic. This fracture should heal with [place] J collar immobilization.  Patient is stable for discharge home and should follow up with Dr Marcello Moores in one week.  I would have him hold his anticoagulation for one week.

## 2022-01-07 NOTE — Therapy (Signed)
OUTPATIENT OCCUPATIONAL THERAPY NEURO EVALUATION  Patient Name: [name] MRN: [id number] DOB:Jan 26, 1946, 76 y.o., male Today's Date: 01/08/2022  PCP: Dr. Maury Dus REFERRING PROVIDER: Wellington Hampshire, PA-C    OT End of Session - 01/08/22 1001     Visit Number 1    Number of Visits 1    Date for OT Re-Evaluation --   eval only   Authorization Type [employer]    OT Start Time [id number]    OT Stop Time 0930    OT Time Calculation (min) 41 min    Activity Tolerance Patient tolerated treatment well    Behavior During Therapy WFL for tasks assessed/performed             Past Medical History:  Diagnosis Date   Hyperlipidemia    Low testosterone    Prediabetes    "prediabetic; lost weight; increased activity; now blood sugar is fine" (08/16/2015   Pulmonary embolism ([place]) 08/15/2015   "bilateral large clots"   Rosacea    Skin cancer    "left groin area"   Walking pneumonia 1990s X 1   Past Surgical History:  Procedure Laterality Date   APPENDECTOMY  1967   CHOLECYSTECTOMY OPEN  ~ 2006   ORIF TIBIA & FIBULA FRACTURES Right 2005   "has a rod in it now"   TONSILLECTOMY  1950s   Patient Active Problem List   Diagnosis Date Noted   Frontal skull fracture ([place]) 01/04/2022   Latent syphilis with positive serology 10/11/2016   Positive RPR test 05/01/2016   Pulmonary emboli ([place]) 08/15/2015   Pulmonary embolism ([place]) 08/15/2015    ONSET DATE: 01/03/22  REFERRING DIAG: S02.0XXB (ICD-10-CM) - Open fracture of frontal bone, initial encounter ([place]); LOC with L frontal bone fx L maxillary sinus fx and C6 fx.    THERAPY DIAG:  Attention and concentration deficit  Rationale for Evaluation and Treatment Rehabilitation  SUBJECTIVE:   SUBJECTIVE STATEMENT: Pt reports denies cognitive changes, visual changes, dizziness, or balance changes.    Pt accompanied by: significant other  wife-Rebecca  PERTINENT HISTORY: Pt is a 76 y/o male admitted after fall with LOC.  Found to have L frontal bone fx, L maxillary sinus fx and C6 endplate fx. PMH includes hx of DVT/PE, Hyperlipidemia, skin cancer, hx of R tibia and fibula fx s/p ORIF in 2005  PRECAUTIONS: Cervical and Fall, no driving  WEIGHT BEARING RESTRICTIONS No  PAIN:  Are you having pain? Yes: NPRS scale: 3/10 Pain location: head (location of stitches) Pain description: sore Aggravating factors: touching Relieving factors: nothing--OT not addressing due to nature  FALLS: Has patient fallen in last 6 months? Yes. Number of falls 1  with injury--Chasing grandchild and tripped and hit birdbath  LIVING ENVIRONMENT: Lives with: lives with their spouse  PLOF: Independent  PATIENT GOALS  none reported for OT  OBJECTIVE:   HAND DOMINANCE: Right  ADLs: Transfers/ambulation related to ADLs:  mod I Eating: mod I Grooming: mod I UB Dressing: mod I LB Dressing: mod I Toileting: mod I Bathing: assist for collar management (wears collar in shower) Tub Shower transfers: mod I (walk in shower)  IADLs: Light housekeeping: pt able to perform light activities mod I.  (Pt enjoyed doing yardwork and working on cars prior)--limited due to precautions Meal Prep: simple snack prep Community mobility: not driving currently due to restrictions  MOBILITY STATUS: Independent.  Pt able to pick up object from floor with no LOB.  See PT eval for details  UPPER EXTREMITY ROM:  BUEs grossly WNL (did not test overhead UE movements due to cervical precautions)   UPPER EXTREMITY MMT:   Shoulder/proximal UE strength not tested due to precautions  HAND FUNCTION: Grip strength: Right: 73.6 lbs; Left: 84.8 lbs  COORDINATION: 9 Hole Peg test: Right: 23.85 sec; Left: 26.72 sec  SENSATION: WFL  Pt denies changes  COGNITION: Overall cognitive status: Within functional limits for tasks assessed.    Pt/wife deny changes and report pt is at baseline.  Pt did become emotional briefly when speaking of injury.     Pt unable to fully report precautions (noted no driving only).--OT reviewed cervical precautions with pt/wife and answered questions regarding activity as able--pt/wife to discuss further/clarify with MD.  (Wife reports that they weren't told much in hospital regarding precautions except no driving and to wear cervical collar).    Pt completed trail-making part B with good accuracy, min incr time.  Pt able to spell "WORLD" backwards and count backwards by 7's (WNL).    VISION: Subjective report: pt reports vision at baseline, no visual changes.  L eye red from initial injury.  Pt denies dizziness Baseline vision: No visual deficits and Wears glasses for reading only    TODAY'S TREATMENT:  N/a   PATIENT EDUCATION: Education details: OT eval results and recommendation for no further OT at this time.  Reviewed typical cervical precautions (wear collar at all times, no lifting >5lbs, no pushing/pulling, no yard work, no driving, no overhead activity).--pt/wife to call MD for clarification/questions Person educated: Patient Education method: Explanation Education comprehension: verbalized understanding     GOALS:  n/a--eval only   ASSESSMENT:  CLINICAL IMPRESSION: Patient is a 76 y.o. male who was seen today for occupational therapy evaluation for LOC with L frontal bone fx L maxillary sinus fx and C6 fx.   Pt is s/p fall with LOC as pt tripped when chasing grandchild 01/03/22.  Pt found to have L frontal bone fx, L maxillary sinus fx and C6 endplate fx.  PMH includes:  hx of DVT/PE, Hyperlipidemia, skin cancer, hx of R tibia and fibula fx s/p ORIF in 2005.  Pt presents today with possible mild cognitive changes with noted possible memory deficits in the hospital.  Pt/wife report that pt is now at baseline for cognition.  Vision, sensation, UE coordination, UE strength, UE ROM appears WFL (within limits of cervical precautions).  See PT eval for balance details.  Pt is mod I for BADLs and  light IADLs as precautions allow.  No further skilled occupational therapy recommended at this time.  PERFORMANCE DEFICITS in functional skills including IADLs (limited by precautions), cognitive skills including safety awareness (needed review of precautions)  IMPAIRMENTS are limiting patient from IADLs and leisure.   COMORBIDITIES has no other co-morbidities that affects occupational performance. Patient will benefit from skilled OT to address above impairments and improve overall function.  MODIFICATION OR ASSISTANCE TO COMPLETE EVALUATION: No modification of tasks or assist necessary to complete an evaluation.  OT OCCUPATIONAL PROFILE AND HISTORY: Detailed assessment: Review of records and additional review of physical, cognitive, psychosocial history related to current functional performance.  CLINICAL DECISION MAKING: LOW - limited treatment options, no task modification necessary  EVALUATION COMPLEXITY: Low    PLAN: OT FREQUENCY:  eval only  PLANNED INTERVENTIONS: self care/ADL training  RECOMMENDED OTHER SERVICES: PT eval completed today--see associated note for details  CONSULTED AND AGREED WITH PLAN OF CARE: Patient and family member/caregiver  PLAN FOR NEXT SESSION: eval  only   [facility], [place] 01/08/2022, 3:47 PM   Vianne Bulls, OTR/L [facility] [address] [place], [place]  [address] [phone] phone [phone] 01/08/22 3:47 PM

## 2022-01-08 ENCOUNTER — Encounter: Payer: Self-pay | Admitting: Rehabilitation

## 2022-01-08 ENCOUNTER — Encounter: Payer: Self-pay | Admitting: Occupational Therapy

## 2022-01-08 ENCOUNTER — Ambulatory Visit: Payer: Medicare PPO | Attending: Physician Assistant | Admitting: Rehabilitation

## 2022-01-08 ENCOUNTER — Ambulatory Visit: Payer: Medicare PPO | Admitting: Occupational Therapy

## 2022-01-08 DIAGNOSIS — R41840 Attention and concentration deficit: Secondary | ICD-10-CM | POA: Insufficient documentation

## 2022-01-08 DIAGNOSIS — S020XXB Fracture of vault of skull, initial encounter for open fracture: Secondary | ICD-10-CM | POA: Insufficient documentation

## 2022-01-08 DIAGNOSIS — R2681 Unsteadiness on feet: Secondary | ICD-10-CM | POA: Diagnosis not present

## 2022-01-08 DIAGNOSIS — F0781 Postconcussional syndrome: Secondary | ICD-10-CM | POA: Diagnosis not present

## 2022-01-08 DIAGNOSIS — S020XXA Fracture of vault of skull, initial encounter for closed fracture: Secondary | ICD-10-CM | POA: Diagnosis not present

## 2022-01-08 NOTE — Therapy (Signed)
OUTPATIENT PHYSICAL THERAPY NEURO EVALUATION   Patient Name: [name] MRN: [id number] DOB:Nov 01, 1945, 76 y.o., male Today's Date: 01/08/2022   PCP: Maury Dus, MD REFERRING PROVIDER: Wellington Hampshire, PA-C    PT End of Session - 01/08/22 0936     Visit Number 1    Number of Visits 5    Date for PT Re-Evaluation 56/81/27   cert for 45 days to allow time for MD visit   Authorization Type [employer] (10th visit needed)    Progress Note Due on Visit 10    PT Start Time 0931    PT Stop Time 1018    PT Time Calculation (min) 47 min    Equipment Utilized During Treatment Gait belt;Cervical collar    Activity Tolerance Patient tolerated treatment well    Behavior During Therapy WFL for tasks assessed/performed             Past Medical History:  Diagnosis Date   Hyperlipidemia    Low testosterone    Prediabetes    "prediabetic; lost weight; increased activity; now blood sugar is fine" (08/16/2015   Pulmonary embolism ([place]) 08/15/2015   "bilateral large clots"   Rosacea    Skin cancer    "left groin area"   Walking pneumonia 1990s X 1   Past Surgical History:  Procedure Laterality Date   APPENDECTOMY  1967   CHOLECYSTECTOMY OPEN  ~ 2006   ORIF TIBIA & FIBULA FRACTURES Right 2005   "has a rod in it now"   TONSILLECTOMY  1950s   Patient Active Problem List   Diagnosis Date Noted   Frontal skull fracture ([place]) 01/04/2022   Latent syphilis with positive serology 10/11/2016   Positive RPR test 05/01/2016   Pulmonary emboli ([place]) 08/15/2015   Pulmonary embolism ([place]) 08/15/2015    ONSET DATE: 01/04/2022   REFERRING DIAG: N17.0XXB (ICD-10-CM) - Open fracture of frontal bone, initial encounter ([place]) S02.0XXA (ICD-10-CM) - Closed fracture of frontal bone, initial encounter ([place])   THERAPY DIAG:  Post concussive syndrome  Unsteadiness on feet  Rationale for Evaluation and Treatment Rehabilitation  SUBJECTIVE:                                                                                                                                                                                               SUBJECTIVE STATEMENT: I am out in the yard a lot, so I am super aware of slowing down outside and taking my time with things.  I want to be able to get back to doing all my activities and driving especially.  Pt accompanied by: significant  other [place] Guiles)  PERTINENT HISTORY: Pt is a 76 y/o male admitted after fall with LOC. Found to have L frontal bone fx, L maxillary sinus fx and C6 endplate fx. PMH includes DVT and skin cancer.   PAIN:  Are you having pain? No  PRECAUTIONS: Cervical  WEIGHT BEARING RESTRICTIONS Yes No lifting over 5lbs, no overhead motion, no pushing/pulling   FALLS: Has patient fallen in last 6 months? No  LIVING ENVIRONMENT: Lives with: lives with their family and lives with their spouse Lives in: House/apartment Stairs: Yes: Internal: 12 steps; on right going up and External: 5 steps; on right going up and on left going up Has following equipment at home: None  PLOF: Independent with basic ADLs and Wife was present for only shower he has had at home   PATIENT GOALS :  Get back to playing with grandkids, independent   OBJECTIVE:   DIAGNOSTIC FINDINGS: Pt is a 76 y/o male admitted after fall with LOC. Found to have L frontal bone fx, L maxillary sinus fx and C6 endplate fx.    COGNITION: Overall cognitive status: Within functional limits for tasks assessed   SENSATION: WFL  COORDINATION: WFL    POSTURE:  Cervical posture is held in place by [facility] J collar   LOWER EXTREMITY ROM:      Right Eval Left Eval  Hip flexion [facility]  Hip extension    Hip abduction    Hip adduction    Hip internal rotation    Hip external rotation    Knee flexion    Knee extension    Ankle dorsiflexion    Ankle plantarflexion    Ankle inversion    Ankle eversion     (Blank rows = not tested) WFL for all   LOWER  EXTREMITY MMT:    MMT Right Eval Left Eval  Hip flexion [facility]   Hip extension    Hip abduction    Hip adduction    Hip internal rotation    Hip external rotation    Knee flexion    Knee extension    Ankle dorsiflexion    Ankle plantarflexion    Ankle inversion    Ankle eversion    (Blank rows = not tested) WFL for all    TRANSFERS: Assistive device utilized:  none   Sit to stand: Modified independence Stand to sit: Modified independence Chair to chair: Modified independence Floor:  did not perform due to cervical precautions    STAIRS:  Level of Assistance: SBA  Stair Negotiation Technique: Alternating Pattern  with Single Rail on Right  Number of Stairs: 4   Height of Stairs: 6  Comments: Pt able to perform at S level with min cues for attempting to use alt pattern.   GAIT: Gait pattern: WFL Distance walked: 500' Assistive device utilized: None Level of assistance: Modified independence Comments: Overall WFL, no overt LOB, did have mild staggering with balance challenges and outdoor gait over grassy surfaces.   FUNCTIONAL TESTs:  5 times sit to stand: 11.16 secs without UE support 10 meter walk test: 3.88 ft/sec without device  Functional gait assessment: 24/30     TODAY'S TREATMENT:  Assessed balance further with SLS on rubber mulch outdoors and pt able to do x 20 secs following single mild LOB, able to recover on his own. Wife reports he is about where he was at baseline.    PATIENT EDUCATION: Education details: Educated on evaluation results, POC, goals and contacting  PT once collar comes off (hopefully next week) and will have him follow up 1-2 more times to ensure safe with high level balance.  Person educated: Patient and Spouse Education method: Explanation, Demonstration, and Verbal cues Education comprehension: verbalized understanding and needs further education   HOME EXERCISE PROGRAM: None initiated on eval.    GOALS: Goals reviewed  with patient? Yes  SHORT TERM GOALS: Target date:  =LTGs   LONG TERM GOALS: Target date: 02/22/2022 (only anticipating needing 1-2 visits, writing POC longer to allow MD visit)  Pt will be IND with final HEP in order to indicate improved functional mobility and dec fall risk. Baseline:  Goal status: INITIAL  2.  Pt will improve FGA to >/=28/30 in order to indicate dec fall risk.   Baseline: 24/30  Goal status: INITIAL  3.  Pt will perform high level outdoor activities (throwing/catching/kicking) as well as getting up/down from floor at mod I level in order to return to play with grandchildren.  Baseline:  Goal status: INITIAL   ASSESSMENT:  CLINICAL IMPRESSION: Patient is a 76 y.o. male who was seen today for physical therapy evaluation and treatment for concussion and C6 fracture following a fall playing with grandchildren, hitting head on bird bath, losing consciousness.  Found to have L frontal bone fx, L maxillary sinus fx and C6 endplate fx. PMH includes DVT and skin cancer.  Upon PT evaluation, gait speed is WFL, 5TSS is WFL and FGA score is 24/30 indicative of medium fall risk however unable to fully test vertical and horizontal head motions with gait due to cervical precautions.  Would like to see pt for 1-2 more follow up appts following removal of cervical collar.  Wife is to keep in contact with [place] and will schedule once this occurs.     OBJECTIVE IMPAIRMENTS decreased balance, decreased mobility, and decreased safety awareness.   ACTIVITY LIMITATIONS lifting, bending, squatting, and transfers  PARTICIPATION LIMITATIONS: shopping, community activity, yard work, and playing with grandchildren   PERSONAL FACTORS 3+ comorbidities: see above  are also affecting patient's functional outcome.   REHAB POTENTIAL: Excellent  CLINICAL DECISION MAKING: Evolving/moderate complexity  EVALUATION COMPLEXITY: Moderate  PLAN: PT FREQUENCY: 1x/week  PT DURATION: 6 weeks  PLANNED  INTERVENTIONS: Therapeutic exercises, Therapeutic activity, Neuromuscular re-education, Balance training, Gait training, Patient/Family education, Stair training, and Vestibular training  PLAN FOR NEXT SESSION: When returns and without brace, re-assess FGA, give high level HEP and do high level tasks outdoors to determine if safe to do with grandkids.    Cameron Sprang, PT, MPT [facility] [address] [place], [place], [address] Phone: [phone]   Fax:  [phone] 01/08/22, 1:04 PM

## 2022-01-15 DIAGNOSIS — W1800XS Striking against unspecified object with subsequent fall, sequela: Secondary | ICD-10-CM | POA: Diagnosis not present

## 2022-01-15 DIAGNOSIS — E1169 Type 2 diabetes mellitus with other specified complication: Secondary | ICD-10-CM | POA: Diagnosis not present

## 2022-01-15 DIAGNOSIS — S0291XD Unspecified fracture of skull, subsequent encounter for fracture with routine healing: Secondary | ICD-10-CM | POA: Diagnosis not present

## 2022-01-15 DIAGNOSIS — R030 Elevated blood-pressure reading, without diagnosis of hypertension: Secondary | ICD-10-CM | POA: Diagnosis not present

## 2022-01-15 DIAGNOSIS — E785 Hyperlipidemia, unspecified: Secondary | ICD-10-CM | POA: Diagnosis not present

## 2022-01-18 DIAGNOSIS — S12501A Unspecified nondisplaced fracture of sixth cervical vertebra, initial encounter for closed fracture: Secondary | ICD-10-CM | POA: Diagnosis not present

## 2022-01-18 DIAGNOSIS — Z6825 Body mass index (BMI) 25.0-25.9, adult: Secondary | ICD-10-CM | POA: Diagnosis not present

## 2022-01-18 DIAGNOSIS — S020XXB Fracture of vault of skull, initial encounter for open fracture: Secondary | ICD-10-CM | POA: Diagnosis not present

## 2022-02-07 ENCOUNTER — Encounter: Payer: Self-pay | Admitting: Physical Medicine & Rehabilitation

## 2022-02-14 DIAGNOSIS — E1165 Type 2 diabetes mellitus with hyperglycemia: Secondary | ICD-10-CM | POA: Diagnosis not present

## 2022-02-20 DIAGNOSIS — S020XXB Fracture of vault of skull, initial encounter for open fracture: Secondary | ICD-10-CM | POA: Diagnosis not present

## 2022-02-20 DIAGNOSIS — S12501A Unspecified nondisplaced fracture of sixth cervical vertebra, initial encounter for closed fracture: Secondary | ICD-10-CM | POA: Diagnosis not present

## 2022-05-08 ENCOUNTER — Encounter: Payer: Medicare PPO | Attending: Physical Medicine & Rehabilitation | Admitting: Physical Medicine & Rehabilitation

## 2022-05-08 ENCOUNTER — Encounter: Payer: Self-pay | Admitting: Physical Medicine & Rehabilitation

## 2022-05-08 DIAGNOSIS — Z87820 Personal history of traumatic brain injury: Secondary | ICD-10-CM | POA: Diagnosis not present

## 2022-05-08 NOTE — Patient Instructions (Signed)
ALWAYS FEEL FREE TO CALL OUR OFFICE WITH ANY PROBLEMS OR QUESTIONS ([phone])

## 2022-05-08 NOTE — Progress Notes (Signed)
Subjective:    Patient ID: [name], male    DOB: 03/07/1946, 76 y.o.   MRN: [id number]  HPI  This is a initial office visit for [name].  He is a 76 year old male who tripped and fell while playing with his grandchildren on June 8.  He fell and hit his head on a birdbath and with loss consciousness.  Imaging revealed a nondisplaced frontal fracture through the frontal sinus as well as maxillary sinus.  No intracranial bleeding was seen.  CT of the cervical spine showed significant osteoarthritic disease throughout the cervical spine and a linear distraction through the body of C6 without displacement.  Patient was discharged home the following day.  He was told to hold his Eliquis.  He saw physical and occupational therapy at [facility] neuro rehab for a few visits, the last of which was on June 13  After discharge symptoms were minimal.  At this point he has no symptoms at all.  He denies headache, double vision, blurred vision, vertigo, issues with balance, memory lapses, attentional issues, insomnia, mood changes or any other types of emotional deficits.  I noticed some scabs on his left knee and he reported he did fall while working with some chickenwire when his feet got caught on the ground a few weeks ago.   Pain Inventory Average Pain 0 Pain Right Now 0 My pain is  no pain  LOCATION OF PAIN  no pain  BOWEL Number of stools per week: 7 Oral laxative use No  Type of laxative . Enema or suppository use No  History of colostomy No  Incontinent No   BLADDER Normal In and out cath, frequency . Able to self cath  . Bladder incontinence No  Frequent urination No  Leakage with coughing No  Difficulty starting stream No  Incomplete bladder emptying No    Mobility walk without assistance how many minutes can you walk? unlimited ability to climb steps?  yes do you drive?  yes  Function retired  Neuro/Psych No problems in this area  Prior Studies New  pt  Physicians involved in your care New pt   Family History  Problem Relation Age of Onset   Heart attack Father        Early onset, mid-30s   Cancer Mother        Esophagus or stomach   Social History   Socioeconomic History   Marital status: Legally Separated    Spouse name: Not on file   Number of children: Not on file   Years of education: Not on file   Highest education level: Not on file  Occupational History   Not on file  Tobacco Use   Smoking status: Former    Years: 10.00    Types: Cigarettes   Smokeless tobacco: Never   Tobacco comments:    08/16/2015 "smoked very infrequent; smoked the most in my teens; none for 2-3 years"  Substance and Sexual Activity   Alcohol use: Yes    Alcohol/week: 2.0 standard drinks of alcohol    Types: 2 Shots of liquor per week    Comment: "I like moonshine or whiskey"   Drug use: Yes    Types: Marijuana    Comment: 08/16/2015 "I have smoked weed a a couple times"   Sexual activity: Yes  Other Topics Concern   Not on file  Social History Narrative   Not on file   Social Determinants of Health   Financial Resource Strain:  Not on file  Food Insecurity: Not on file  Transportation Needs: Not on file  Physical Activity: Not on file  Stress: Not on file  Social Connections: Not on file   Past Surgical History:  Procedure Laterality Date   APPENDECTOMY  1967   CHOLECYSTECTOMY OPEN  ~ 2006   ORIF TIBIA & FIBULA FRACTURES Right 2005   "has a rod in it now"   TONSILLECTOMY  1950s   Past Medical History:  Diagnosis Date   Hyperlipidemia    Low testosterone    Prediabetes    "prediabetic; lost weight; increased activity; now blood sugar is fine" (08/16/2015   Pulmonary embolism ([place]) 08/15/2015   "bilateral large clots"   Rosacea    Skin cancer    "left groin area"   Walking pneumonia 1990s X 1   BP 130/73   Pulse 64   Ht '6\' 1"'$  (1.854 m)   Wt 197 lb (89.4 kg)   SpO2 96%   BMI 25.99 kg/m   Opioid Risk Score:    Fall Risk Score:  `1  Depression screen [facility] 2/9     05/08/2022    2:58 PM 08/04/2019   11:40 AM 06/25/2017   12:35 PM 11/26/2016    5:38 PM 10/11/2016   11:40 AM 10/05/2016    2:23 PM 04/05/2016    3:48 PM  Depression screen PHQ 2/9  Decreased Interest 0 0 0 0 0 0 0  Down, Depressed, Hopeless 0 0 0 0 0 0 0  PHQ - 2 Score 0 0 0 0 0 0 0  Altered sleeping 0        Tired, decreased energy 0        Change in appetite 0        Feeling bad or failure about yourself  0        Trouble concentrating 0        Moving slowly or fidgety/restless 0        Suicidal thoughts 0        PHQ-9 Score 0            Review of Systems     Objective:   Physical Exam Gen: no distress, normal appearing HEENT: oral mucosa pink and moist, NCAT Cardio: Reg rate Chest: normal effort, normal rate of breathing Abd: soft, non-distended Ext: no edema Psych: pleasant, normal affect Skin: abrasions on left knee Neuro: Alert and oriented x 3. Normal insight and awareness. Intact Memory. Normal language and speech. Cranial nerve exam unremarkable  Musculoskeletal: Full ROM, No pain with AROM or PROM in the neck, trunk, or extremities. Posture appropriate  mild left handed resting tremor. Motor 5/5 in all 4. Normal standing and walking balance. Romberg negative. Sensory exam normal for light touch and pain in all 4 limbs. No limb ataxia or cerebellar signs. No abnormal tone appreciated.          Assessment & Plan:   Hx of concussion with LOC on 6/8 after fall. Associated frontal skull/sinus fx Patient is very fortunate and is without any residual deficits at this point He is very active at home and has had another fall since June.  Discussed safety awareness and being reasonable about activities he pursues.  He has a gutter that needs to be cleaned at 30 feet up in the air.  By no means should he be up on a ladder trying to clean that out Patient may follow-up with me as needed Was a pleasure to  meet him  today!  F/u with me prn. 25 min spent

## 2022-05-30 DIAGNOSIS — E1165 Type 2 diabetes mellitus with hyperglycemia: Secondary | ICD-10-CM | POA: Diagnosis not present

## 2022-05-30 DIAGNOSIS — L719 Rosacea, unspecified: Secondary | ICD-10-CM | POA: Diagnosis not present

## 2022-05-30 DIAGNOSIS — Z0000 Encounter for general adult medical examination without abnormal findings: Secondary | ICD-10-CM | POA: Diagnosis not present

## 2022-05-30 DIAGNOSIS — E291 Testicular hypofunction: Secondary | ICD-10-CM | POA: Diagnosis not present

## 2022-05-30 DIAGNOSIS — F524 Premature ejaculation: Secondary | ICD-10-CM | POA: Diagnosis not present

## 2022-05-30 DIAGNOSIS — N529 Male erectile dysfunction, unspecified: Secondary | ICD-10-CM | POA: Diagnosis not present

## 2022-05-30 DIAGNOSIS — Z1331 Encounter for screening for depression: Secondary | ICD-10-CM | POA: Diagnosis not present

## 2022-05-30 DIAGNOSIS — Z125 Encounter for screening for malignant neoplasm of prostate: Secondary | ICD-10-CM | POA: Diagnosis not present

## 2022-05-30 DIAGNOSIS — A6000 Herpesviral infection of urogenital system, unspecified: Secondary | ICD-10-CM | POA: Diagnosis not present

## 2022-05-30 DIAGNOSIS — E782 Mixed hyperlipidemia: Secondary | ICD-10-CM | POA: Diagnosis not present

## 2022-09-03 DIAGNOSIS — E119 Type 2 diabetes mellitus without complications: Secondary | ICD-10-CM | POA: Diagnosis not present

## 2022-09-03 DIAGNOSIS — L03115 Cellulitis of right lower limb: Secondary | ICD-10-CM | POA: Diagnosis not present

## 2022-12-04 DIAGNOSIS — Z79899 Other long term (current) drug therapy: Secondary | ICD-10-CM | POA: Diagnosis not present

## 2022-12-04 DIAGNOSIS — E291 Testicular hypofunction: Secondary | ICD-10-CM | POA: Diagnosis not present

## 2022-12-04 DIAGNOSIS — N529 Male erectile dysfunction, unspecified: Secondary | ICD-10-CM | POA: Diagnosis not present

## 2022-12-04 DIAGNOSIS — E782 Mixed hyperlipidemia: Secondary | ICD-10-CM | POA: Diagnosis not present

## 2022-12-04 DIAGNOSIS — I825Z1 Chronic embolism and thrombosis of unspecified deep veins of right distal lower extremity: Secondary | ICD-10-CM | POA: Diagnosis not present

## 2022-12-04 DIAGNOSIS — I700 Atherosclerosis of aorta: Secondary | ICD-10-CM | POA: Diagnosis not present

## 2022-12-04 DIAGNOSIS — E1165 Type 2 diabetes mellitus with hyperglycemia: Secondary | ICD-10-CM | POA: Diagnosis not present

## 2022-12-04 DIAGNOSIS — D6869 Other thrombophilia: Secondary | ICD-10-CM | POA: Diagnosis not present

## 2022-12-04 DIAGNOSIS — E1169 Type 2 diabetes mellitus with other specified complication: Secondary | ICD-10-CM | POA: Diagnosis not present

## 2022-12-10 DIAGNOSIS — N5201 Erectile dysfunction due to arterial insufficiency: Secondary | ICD-10-CM | POA: Diagnosis not present

## 2022-12-11 DIAGNOSIS — E291 Testicular hypofunction: Secondary | ICD-10-CM | POA: Diagnosis not present

## 2022-12-11 DIAGNOSIS — E1165 Type 2 diabetes mellitus with hyperglycemia: Secondary | ICD-10-CM | POA: Diagnosis not present

## 2022-12-11 DIAGNOSIS — Z79899 Other long term (current) drug therapy: Secondary | ICD-10-CM | POA: Diagnosis not present

## 2022-12-11 DIAGNOSIS — E782 Mixed hyperlipidemia: Secondary | ICD-10-CM | POA: Diagnosis not present

## 2023-01-02 DIAGNOSIS — E119 Type 2 diabetes mellitus without complications: Secondary | ICD-10-CM | POA: Diagnosis not present

## 2023-01-02 DIAGNOSIS — H353132 Nonexudative age-related macular degeneration, bilateral, intermediate dry stage: Secondary | ICD-10-CM | POA: Diagnosis not present

## 2023-02-14 DIAGNOSIS — D225 Melanocytic nevi of trunk: Secondary | ICD-10-CM | POA: Diagnosis not present

## 2023-02-14 DIAGNOSIS — D0339 Melanoma in situ of other parts of face: Secondary | ICD-10-CM | POA: Diagnosis not present

## 2023-02-14 DIAGNOSIS — D485 Neoplasm of uncertain behavior of skin: Secondary | ICD-10-CM | POA: Diagnosis not present

## 2023-03-11 DIAGNOSIS — D0339 Melanoma in situ of other parts of face: Secondary | ICD-10-CM | POA: Diagnosis not present

## 2023-03-26 DIAGNOSIS — E782 Mixed hyperlipidemia: Secondary | ICD-10-CM | POA: Diagnosis not present

## 2023-03-26 DIAGNOSIS — I825Z1 Chronic embolism and thrombosis of unspecified deep veins of right distal lower extremity: Secondary | ICD-10-CM | POA: Diagnosis not present

## 2023-03-26 DIAGNOSIS — F524 Premature ejaculation: Secondary | ICD-10-CM | POA: Diagnosis not present

## 2023-03-26 DIAGNOSIS — Z630 Problems in relationship with spouse or partner: Secondary | ICD-10-CM | POA: Diagnosis not present

## 2023-03-26 DIAGNOSIS — E291 Testicular hypofunction: Secondary | ICD-10-CM | POA: Diagnosis not present

## 2023-03-26 DIAGNOSIS — E1165 Type 2 diabetes mellitus with hyperglycemia: Secondary | ICD-10-CM | POA: Diagnosis not present

## 2023-04-14 DIAGNOSIS — L570 Actinic keratosis: Secondary | ICD-10-CM | POA: Diagnosis not present

## 2023-04-14 DIAGNOSIS — D225 Melanocytic nevi of trunk: Secondary | ICD-10-CM | POA: Diagnosis not present

## 2023-04-14 DIAGNOSIS — Z85828 Personal history of other malignant neoplasm of skin: Secondary | ICD-10-CM | POA: Diagnosis not present

## 2023-04-14 DIAGNOSIS — L814 Other melanin hyperpigmentation: Secondary | ICD-10-CM | POA: Diagnosis not present

## 2023-04-14 DIAGNOSIS — Z86006 Personal history of melanoma in-situ: Secondary | ICD-10-CM | POA: Diagnosis not present

## 2023-04-14 DIAGNOSIS — L821 Other seborrheic keratosis: Secondary | ICD-10-CM | POA: Diagnosis not present

## 2023-07-11 DIAGNOSIS — Z79899 Other long term (current) drug therapy: Secondary | ICD-10-CM | POA: Diagnosis not present

## 2023-07-11 DIAGNOSIS — Z9181 History of falling: Secondary | ICD-10-CM | POA: Diagnosis not present

## 2023-07-11 DIAGNOSIS — E119 Type 2 diabetes mellitus without complications: Secondary | ICD-10-CM | POA: Diagnosis not present

## 2023-07-11 DIAGNOSIS — E782 Mixed hyperlipidemia: Secondary | ICD-10-CM | POA: Diagnosis not present

## 2023-07-11 DIAGNOSIS — E1129 Type 2 diabetes mellitus with other diabetic kidney complication: Secondary | ICD-10-CM | POA: Diagnosis not present

## 2023-07-11 DIAGNOSIS — Z1159 Encounter for screening for other viral diseases: Secondary | ICD-10-CM | POA: Diagnosis not present

## 2023-07-11 DIAGNOSIS — Z0000 Encounter for general adult medical examination without abnormal findings: Secondary | ICD-10-CM | POA: Diagnosis not present

## 2023-07-11 DIAGNOSIS — R809 Proteinuria, unspecified: Secondary | ICD-10-CM | POA: Diagnosis not present

## 2023-08-06 DIAGNOSIS — E538 Deficiency of other specified B group vitamins: Secondary | ICD-10-CM | POA: Diagnosis not present

## 2023-08-13 DIAGNOSIS — E538 Deficiency of other specified B group vitamins: Secondary | ICD-10-CM | POA: Diagnosis not present

## 2023-08-20 DIAGNOSIS — E538 Deficiency of other specified B group vitamins: Secondary | ICD-10-CM | POA: Diagnosis not present

## 2023-08-29 DIAGNOSIS — E538 Deficiency of other specified B group vitamins: Secondary | ICD-10-CM | POA: Diagnosis not present

## 2023-09-15 DIAGNOSIS — Z860100 Personal history of colon polyps, unspecified: Secondary | ICD-10-CM | POA: Diagnosis not present

## 2023-09-15 DIAGNOSIS — Z09 Encounter for follow-up examination after completed treatment for conditions other than malignant neoplasm: Secondary | ICD-10-CM | POA: Diagnosis not present

## 2023-09-15 DIAGNOSIS — K5730 Diverticulosis of large intestine without perforation or abscess without bleeding: Secondary | ICD-10-CM | POA: Diagnosis not present

## 2023-09-15 DIAGNOSIS — D123 Benign neoplasm of transverse colon: Secondary | ICD-10-CM | POA: Diagnosis not present

## 2023-09-17 DIAGNOSIS — D123 Benign neoplasm of transverse colon: Secondary | ICD-10-CM | POA: Diagnosis not present

## 2023-09-23 DIAGNOSIS — L02412 Cutaneous abscess of left axilla: Secondary | ICD-10-CM | POA: Diagnosis not present

## 2023-12-24 DIAGNOSIS — E291 Testicular hypofunction: Secondary | ICD-10-CM | POA: Diagnosis not present

## 2023-12-24 DIAGNOSIS — N5201 Erectile dysfunction due to arterial insufficiency: Secondary | ICD-10-CM | POA: Diagnosis not present

## 2024-04-06 DIAGNOSIS — E291 Testicular hypofunction: Secondary | ICD-10-CM | POA: Diagnosis not present

## 2024-04-13 DIAGNOSIS — E291 Testicular hypofunction: Secondary | ICD-10-CM | POA: Diagnosis not present

## 2024-04-13 DIAGNOSIS — N5201 Erectile dysfunction due to arterial insufficiency: Secondary | ICD-10-CM | POA: Diagnosis not present

## 2024-04-27 DIAGNOSIS — L814 Other melanin hyperpigmentation: Secondary | ICD-10-CM | POA: Diagnosis not present

## 2024-04-27 DIAGNOSIS — D225 Melanocytic nevi of trunk: Secondary | ICD-10-CM | POA: Diagnosis not present

## 2024-04-27 DIAGNOSIS — Z85828 Personal history of other malignant neoplasm of skin: Secondary | ICD-10-CM | POA: Diagnosis not present

## 2024-04-27 DIAGNOSIS — L821 Other seborrheic keratosis: Secondary | ICD-10-CM | POA: Diagnosis not present

## 2024-04-27 DIAGNOSIS — Z86006 Personal history of melanoma in-situ: Secondary | ICD-10-CM | POA: Diagnosis not present

## 2024-05-18 IMAGING — CT CT CERVICAL SPINE W/O CM
3 of 4 series · 12 of 33 positions shown, 14 images · non-contrast
Comparison: None Available.

CLINICAL DATA: Fall



[Series 4: c_spine 2.0 (person_name) (person_name) · axial · 0.39mm/px · z∈[-257,-129]mm · 4 of 96 slices shown, 5 images]
[im 16/96  soft-tissue]
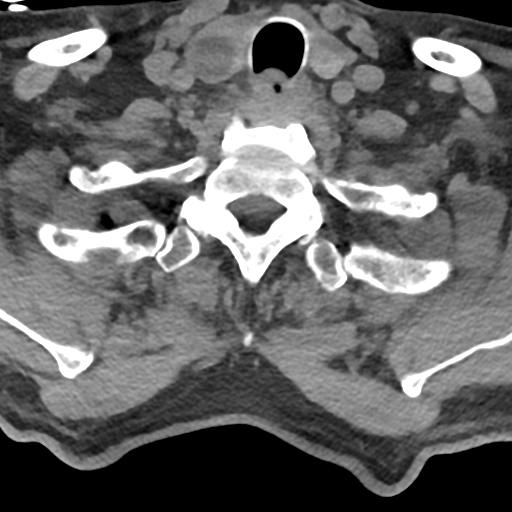
[im 16/96  bone]
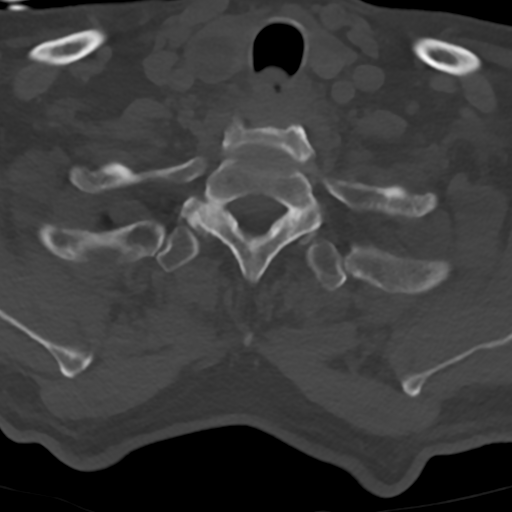
[im 32/96  bone]
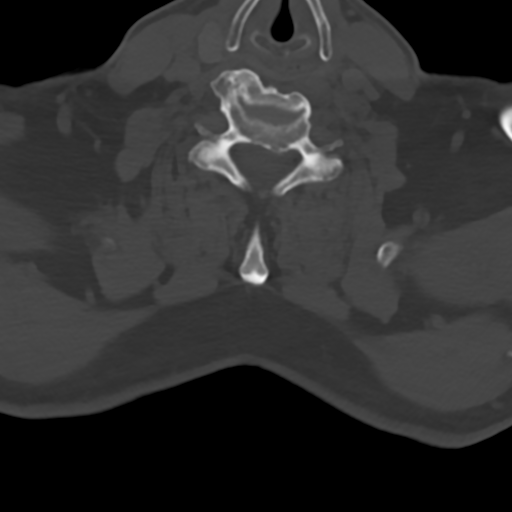
[im 64/96  bone]
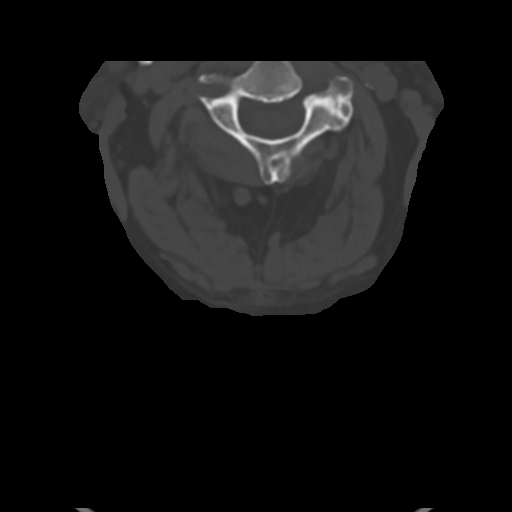
[im 80/96  bone]
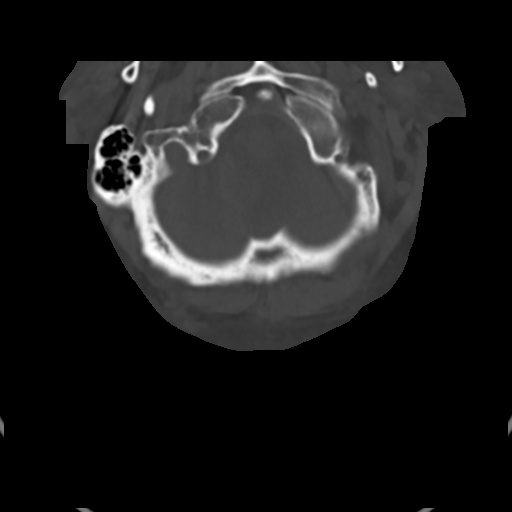

[Series 8: c_spine 2.0 sag bone · sagittal · 0.28mm/px · 5 of 61 slices shown, 6 images]
[im 21/61  bone]
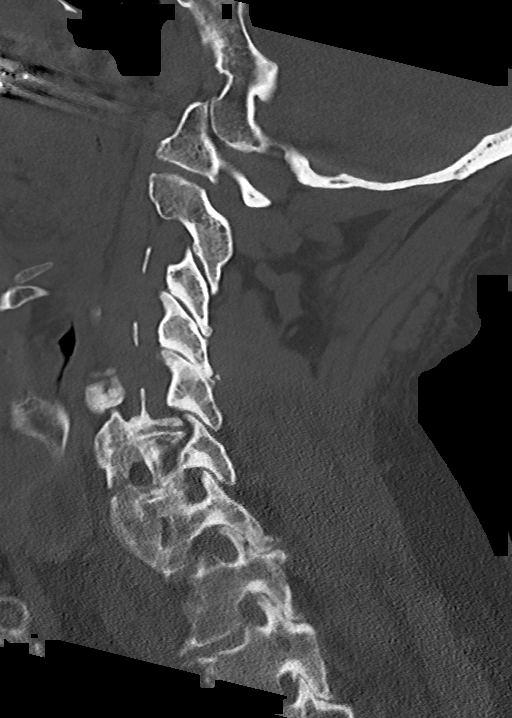
[im 26/61  bone]
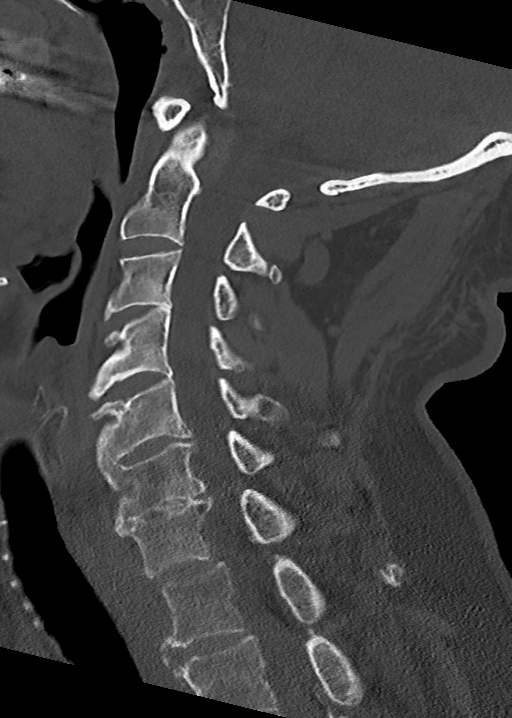
[im 31/61  soft-tissue]
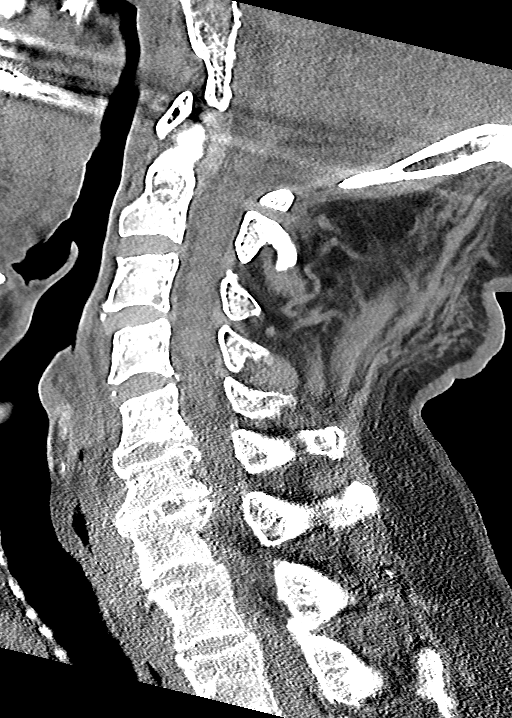
[im 31/61  bone]
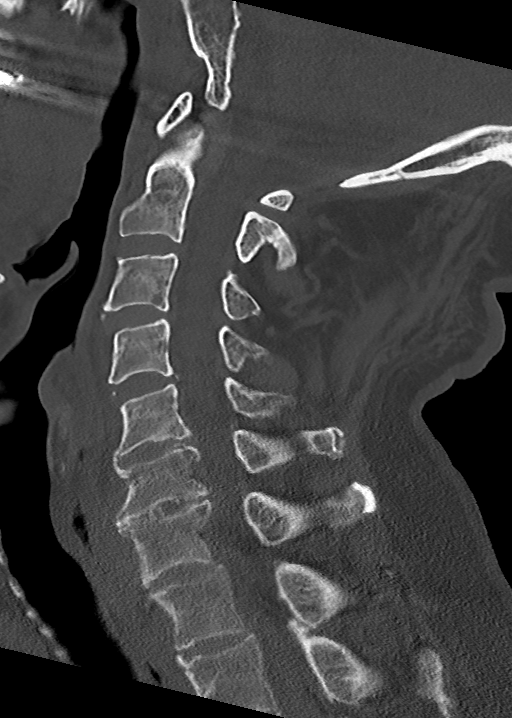
[im 36/61  bone]
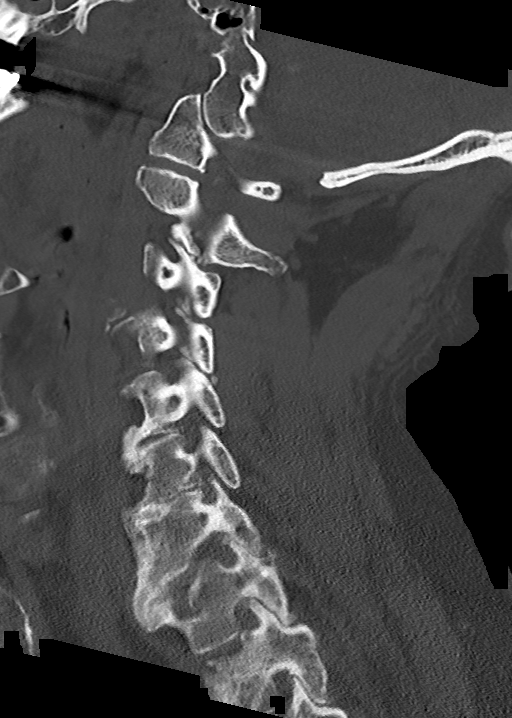
[im 41/61  bone]
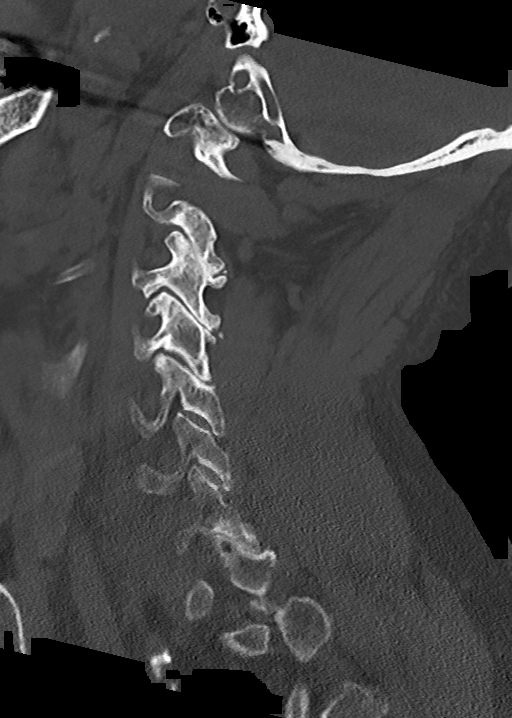

[Series 9: c_spine 2.0 cor bone · coronal · 0.28mm/px · 3 of 61 slices shown]
[im 13/61  bone]
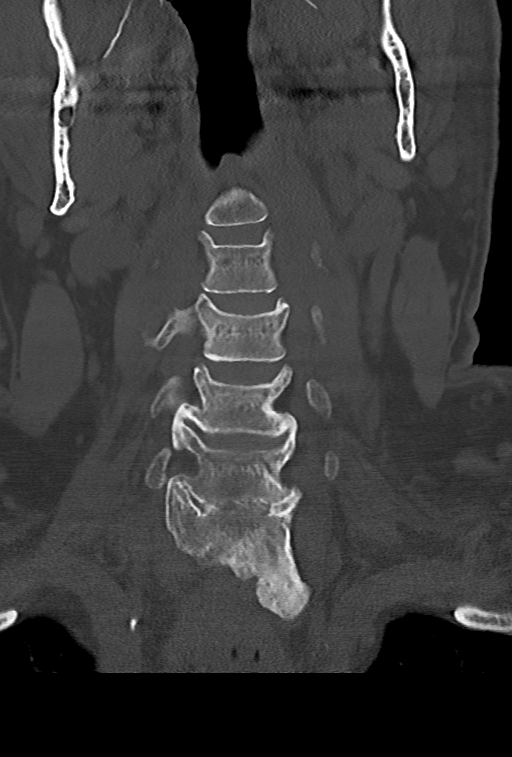
[im 25/61  bone]
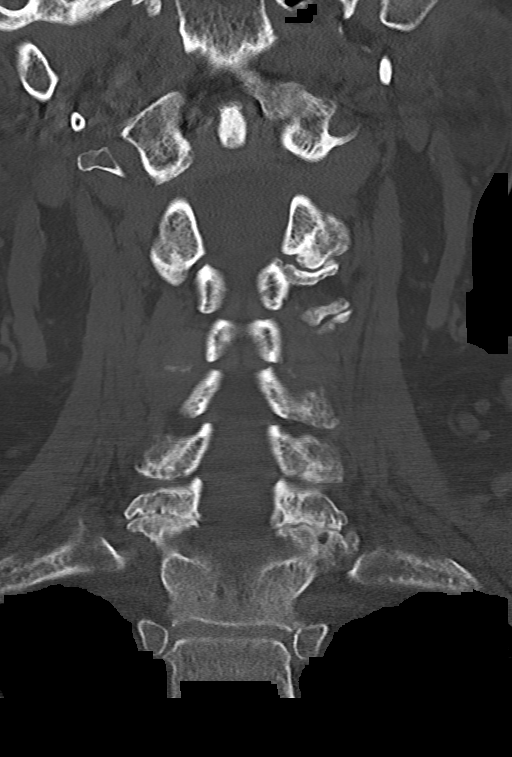
[im 37/61  bone]
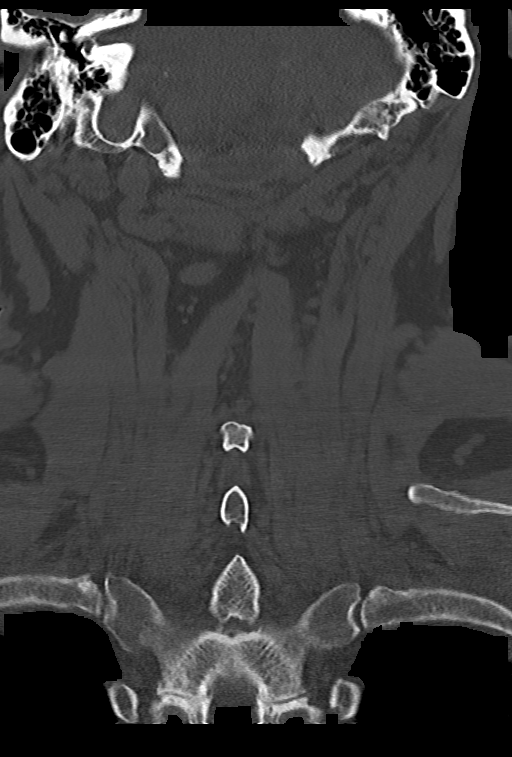

[12 of 33 positions shown; findings below may reference images not displayed]

FINDINGS: CT HEAD FINDINGS

Brain: There is no mass, hemorrhage or extra-axial collection. There
is generalized atrophy without lobar predilection. There is
hypoattenuation of the periventricular white matter, most commonly
indicating chronic ischemic microangiopathy.

Vascular: No abnormal hyperdensity of the major intracranial
arteries or dural venous sinuses. No intracranial atherosclerosis.

Skull: Left frontal bone fracture traverses the anterior and
posterior table of the left frontal sinus.

CT MAXILLOFACIAL FINDINGS

Osseous: Left frontal fracture extends through the anterior and
posterior walls of the left frontal sinus and inferiorly through the
left ethmoid sinus and the roof of the left orbit. There are
nondisplaced fractures of the anterior and lateral walls of the left
maxillary sinus.

Orbits: Minimal edema within the medial extraconal soft tissues of
the left orbit. Otherwise normal orbital soft tissues.

Sinuses: Partial opacification of the left frontal, ethmoid and
maxillary sinuses with blood.

Soft tissues: Left supraorbital soft tissue swelling.

CT CERVICAL SPINE FINDINGS

Alignment: No static subluxation. Facets are aligned. Occipital
condyles and the lateral masses of C1-C2 are aligned.

Skull base and vertebrae: There is a nondisplaced fracture
underlying the superior endplate C6. There is no height loss. The
fracture extends in the anterior-posterior direction through the
anterior and posterior walls.

Soft tissues and spinal canal: No prevertebral fluid or swelling. No
visible canal hematoma.

Disc levels: No advanced spinal canal or neural foraminal stenosis.

Upper chest: No pneumothorax, pulmonary nodule or pleural effusion.

Other: Normal visualized paraspinal cervical soft tissues.
IMPRESSION: 1. Left frontal bone fracture traverses the anterior and posterior
table of the left frontal sinus but there is no visible intracranial
hemorrhage. Consider follow-up head CT in [DATE] hours.
2. Nondisplaced fractures of the anterior and lateral walls of the
left maxillary sinus and the roof of the left orbit.
3. Nondisplaced acute fracture underlying the superior endplate of
C6 without height loss. Fracture involves the anterior and posterior
walls and is compatible with a hyperextension type injury.

Critical Value/emergent results were called by telephone at the time
of interpretation on 01/03/2022 at [DATE] to provider KENDEL
ANNETTE , who verbally acknowledged these results.

## 2024-05-18 IMAGING — CT CT HEAD W/O CM
4 series · 15 of 47 positions shown, 17 images · non-contrast
Comparison: None Available.

CLINICAL DATA: Fall



[Series 3: head without (person_name) · axial · non-contrast · 0.44mm/px · z∈[-131,+14]mm · 7 of 39 slices shown, 9 images]
[im 5/39  brain]
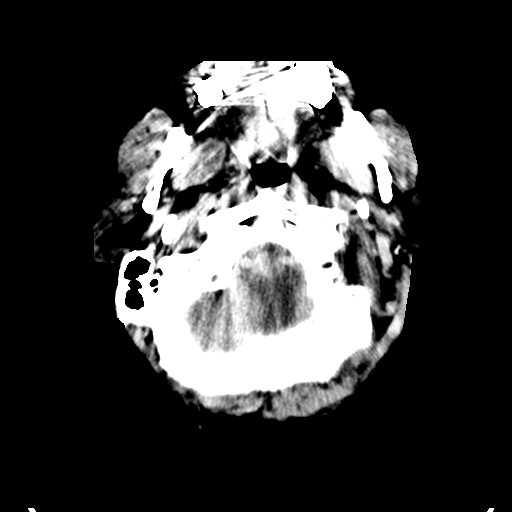
[im 5/39  bone]
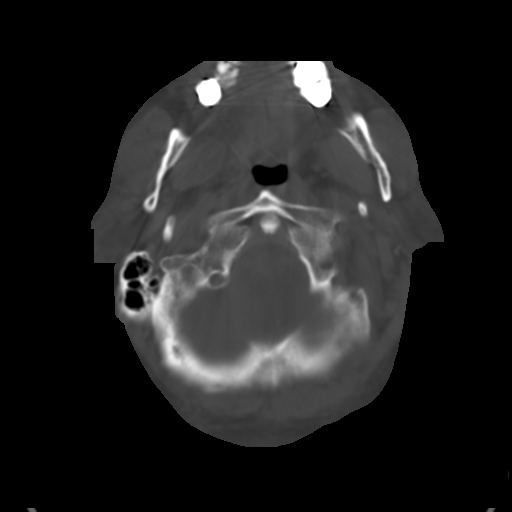
[im 10/39  brain]
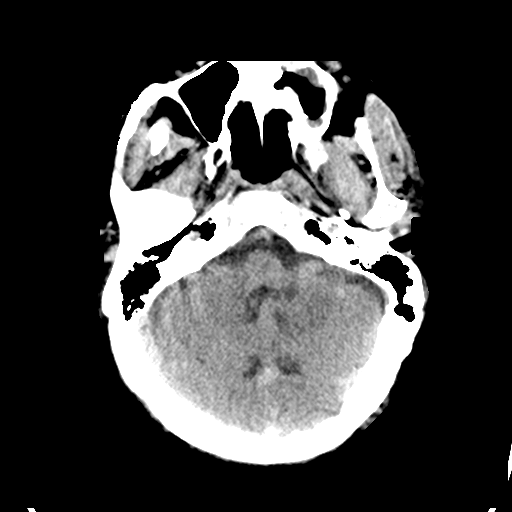
[im 15/39  brain]
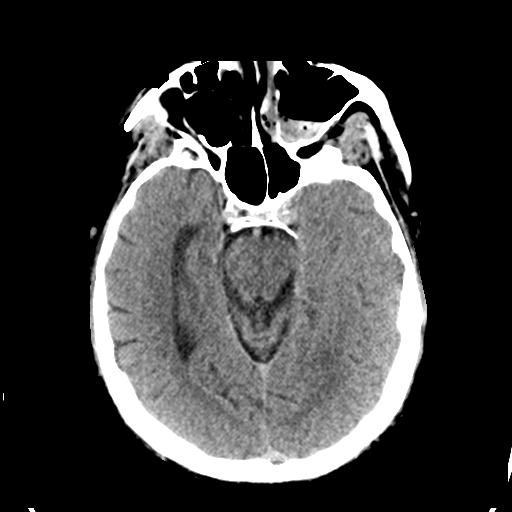
[im 20/39  brain]
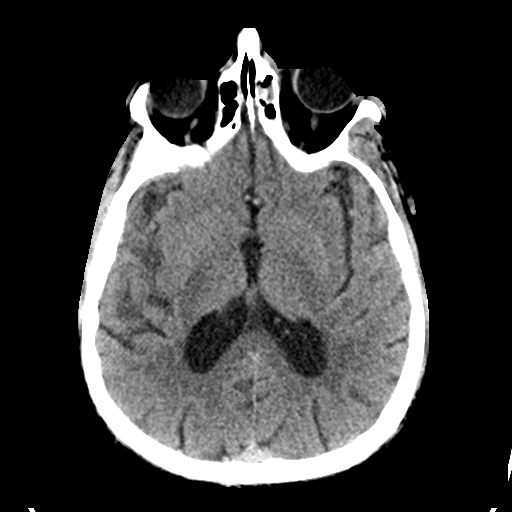
[im 24/39  brain]
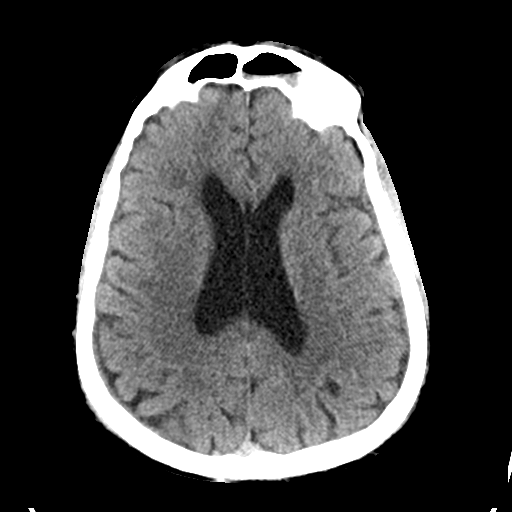
[im 24/39  bone]
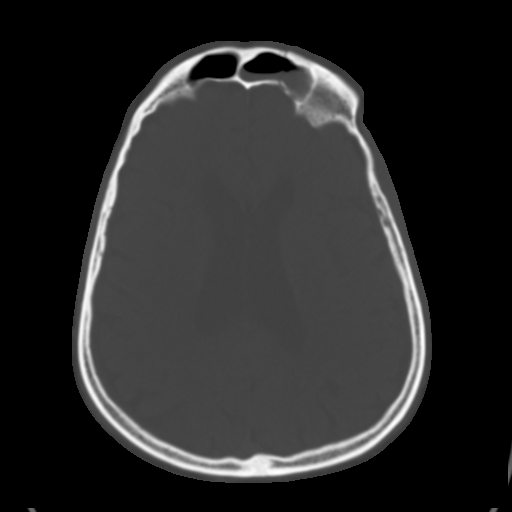
[im 29/39  brain]
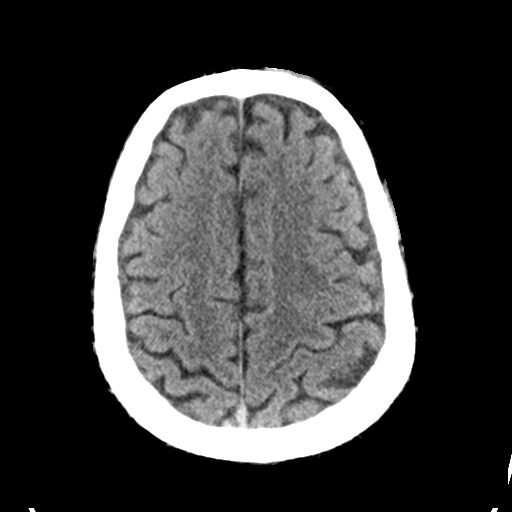
[im 34/39  brain]
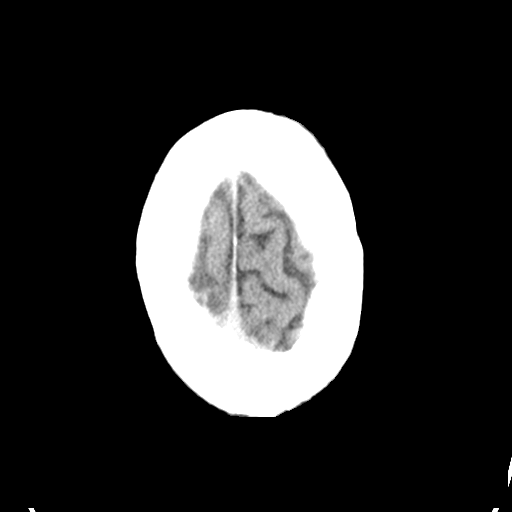

[Series 4: head bone · axial · 0.44mm/px · z∈[-133,-113]mm · 2 of 98 slices shown]
[im 10/98  bone]
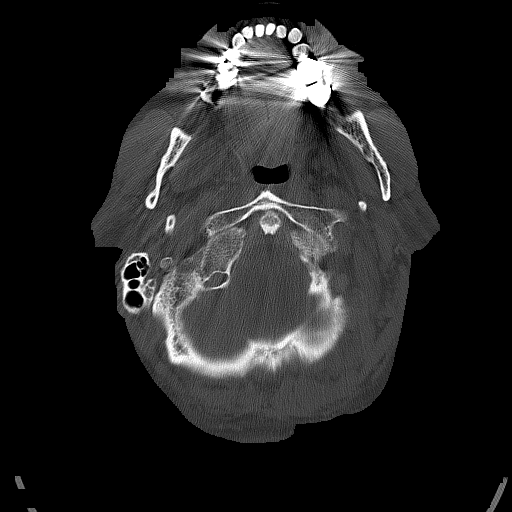
[im 20/98  bone]
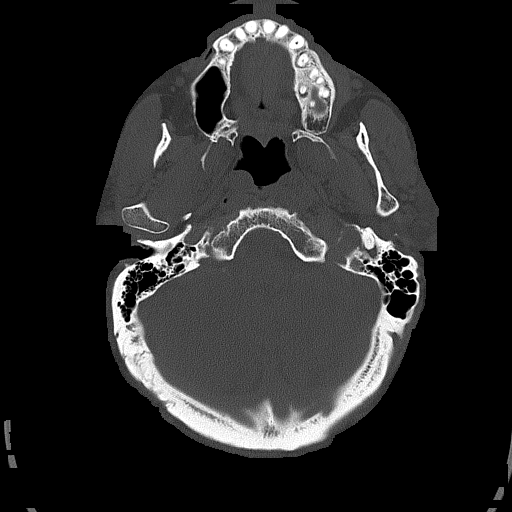

[Series 5: head without cor · coronal · non-contrast · 0.38mm/px · 3 of 72 slices shown]
[im 24/72  brain]
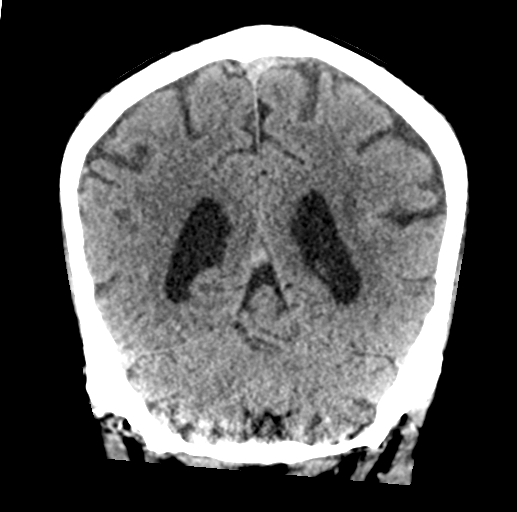
[im 32/72  brain]
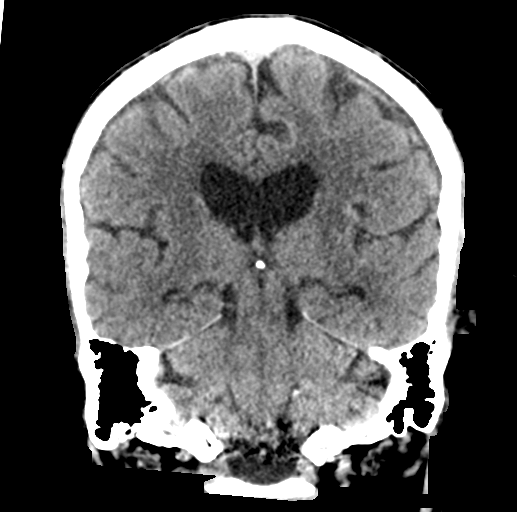
[im 40/72  brain]
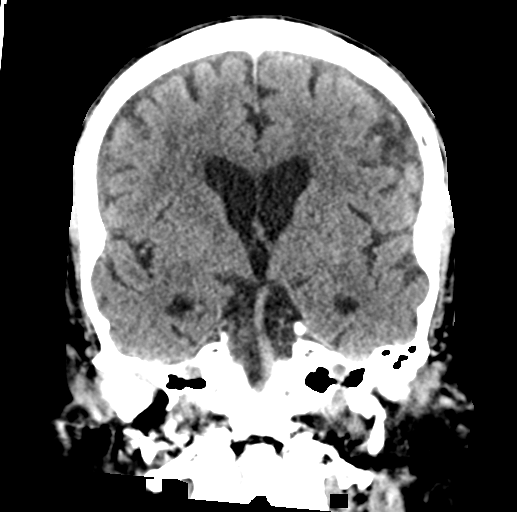

[Series 6: head without sag · sagittal · non-contrast · 0.38mm/px · 3 of 66 slices shown]
[im 22/66  brain]
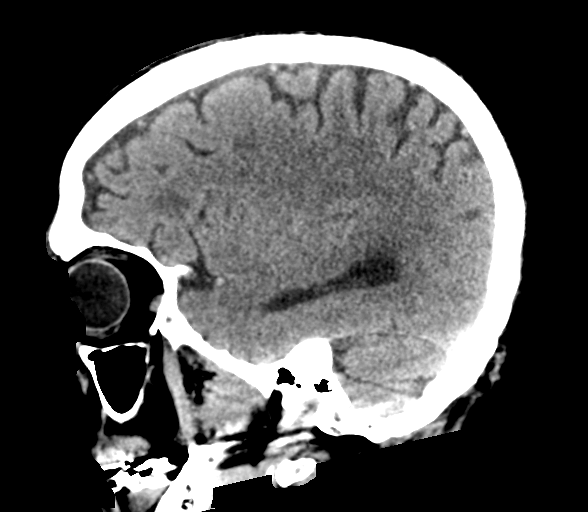
[im 33/66  brain]
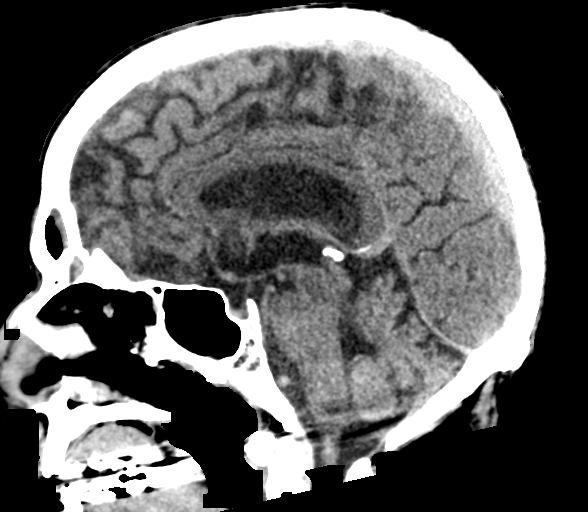
[im 44/66  brain]
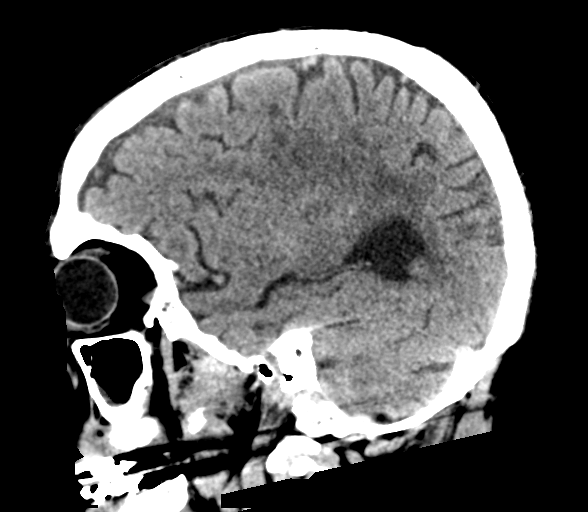

[15 of 47 positions shown; findings below may reference images not displayed]

FINDINGS: CT HEAD FINDINGS

Brain: There is no mass, hemorrhage or extra-axial collection. There
is generalized atrophy without lobar predilection. There is
hypoattenuation of the periventricular white matter, most commonly
indicating chronic ischemic microangiopathy.

Vascular: No abnormal hyperdensity of the major intracranial
arteries or dural venous sinuses. No intracranial atherosclerosis.

Skull: Left frontal bone fracture traverses the anterior and
posterior table of the left frontal sinus.

CT MAXILLOFACIAL FINDINGS

Osseous: Left frontal fracture extends through the anterior and
posterior walls of the left frontal sinus and inferiorly through the
left ethmoid sinus and the roof of the left orbit. There are
nondisplaced fractures of the anterior and lateral walls of the left
maxillary sinus.

Orbits: Minimal edema within the medial extraconal soft tissues of
the left orbit. Otherwise normal orbital soft tissues.

Sinuses: Partial opacification of the left frontal, ethmoid and
maxillary sinuses with blood.

Soft tissues: Left supraorbital soft tissue swelling.

CT CERVICAL SPINE FINDINGS

Alignment: No static subluxation. Facets are aligned. Occipital
condyles and the lateral masses of C1-C2 are aligned.

Skull base and vertebrae: There is a nondisplaced fracture
underlying the superior endplate C6. There is no height loss. The
fracture extends in the anterior-posterior direction through the
anterior and posterior walls.

Soft tissues and spinal canal: No prevertebral fluid or swelling. No
visible canal hematoma.

Disc levels: No advanced spinal canal or neural foraminal stenosis.

Upper chest: No pneumothorax, pulmonary nodule or pleural effusion.

Other: Normal visualized paraspinal cervical soft tissues.
IMPRESSION: 1. Left frontal bone fracture traverses the anterior and posterior
table of the left frontal sinus but there is no visible intracranial
hemorrhage. Consider follow-up head CT in [DATE] hours.
2. Nondisplaced fractures of the anterior and lateral walls of the
left maxillary sinus and the roof of the left orbit.
3. Nondisplaced acute fracture underlying the superior endplate of
C6 without height loss. Fracture involves the anterior and posterior
walls and is compatible with a hyperextension type injury.

Critical Value/emergent results were called by telephone at the time
of interpretation on 01/03/2022 at [DATE] to provider KENDEL
ANNETTE , who verbally acknowledged these results.

## 2024-05-19 IMAGING — CT CT HEAD W/O CM
4 series · 16 of 47 positions shown, 18 images · non-contrast
Comparison: 01/03/2022.

CLINICAL DATA: Head trauma, frontal skull fracture.



[Series 3: head wo · axial · 0.47mm/px · z∈[-151,-21]mm · 7 of 36 slices shown, 9 images]
[im 5/36  brain]
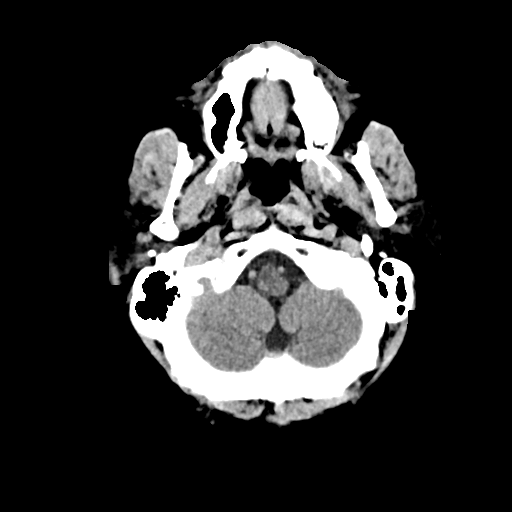
[im 5/36  bone]
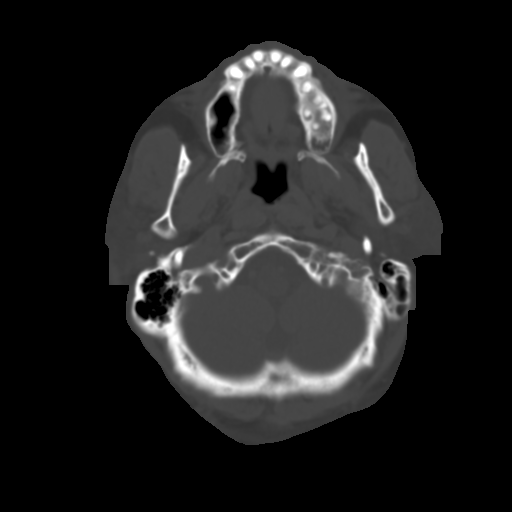
[im 9/36  brain]
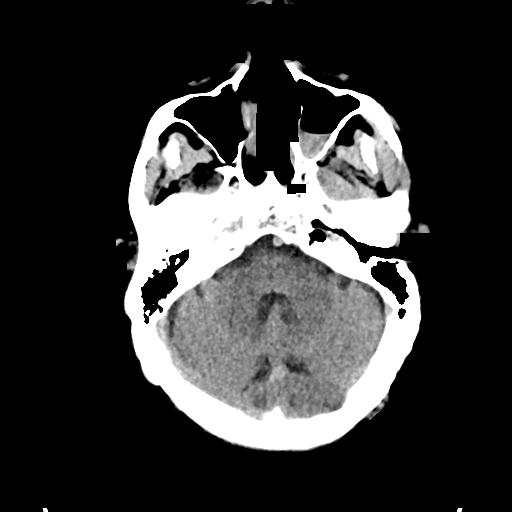
[im 14/36  brain]
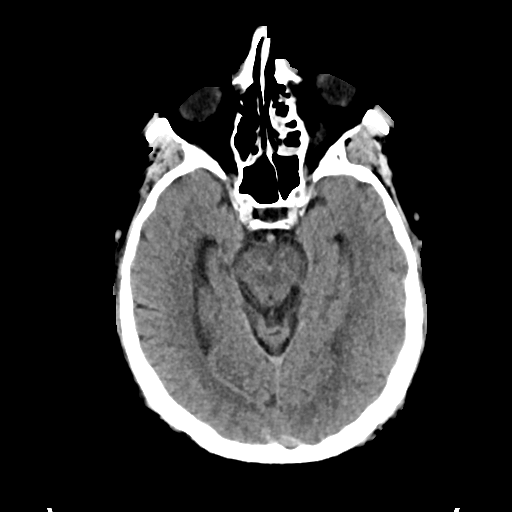
[im 18/36  brain]
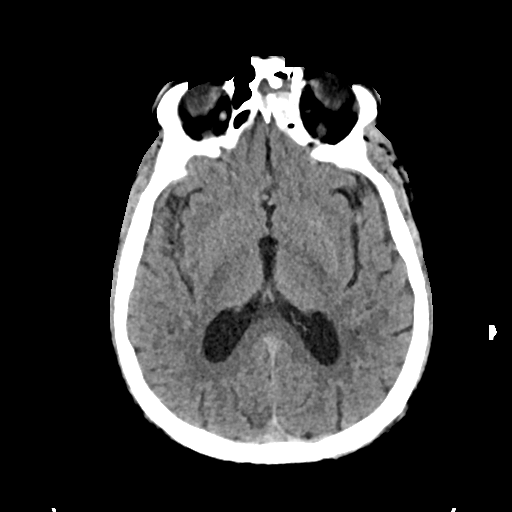
[im 22/36  brain]
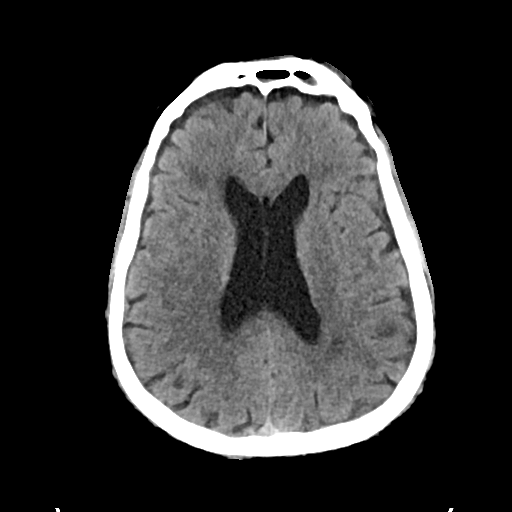
[im 22/36  bone]
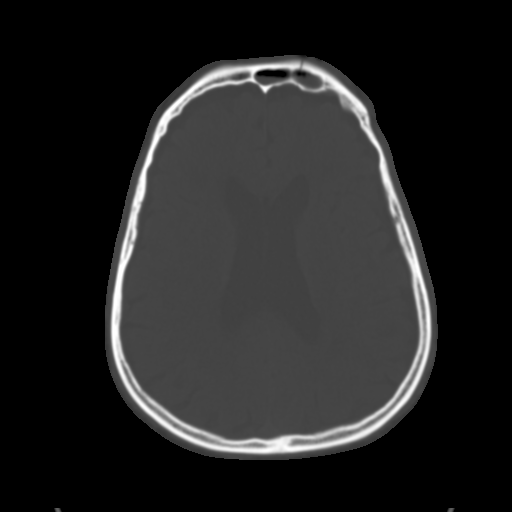
[im 27/36  brain]
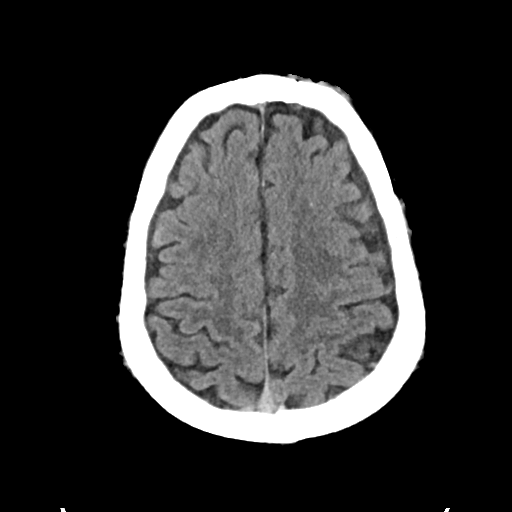
[im 31/36  brain]
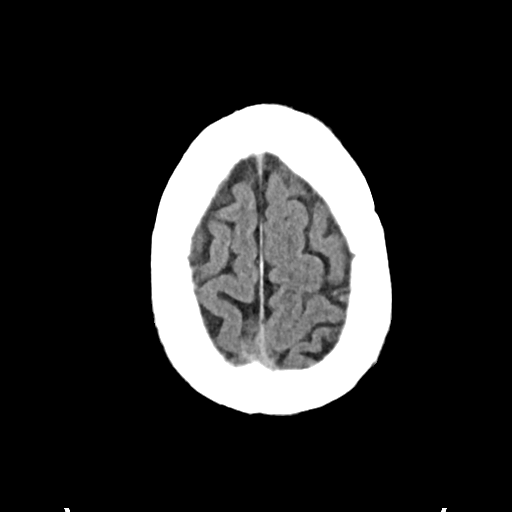

[Series 4: head bone · axial · 0.47mm/px · z∈[-155,-119]mm · 3 of 89 slices shown]
[im 9/89  bone]
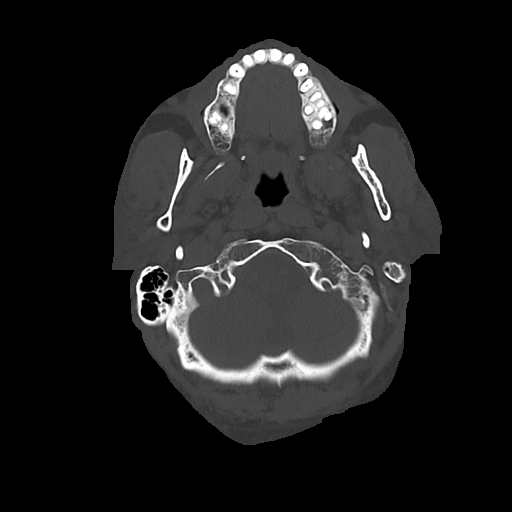
[im 18/89  bone]
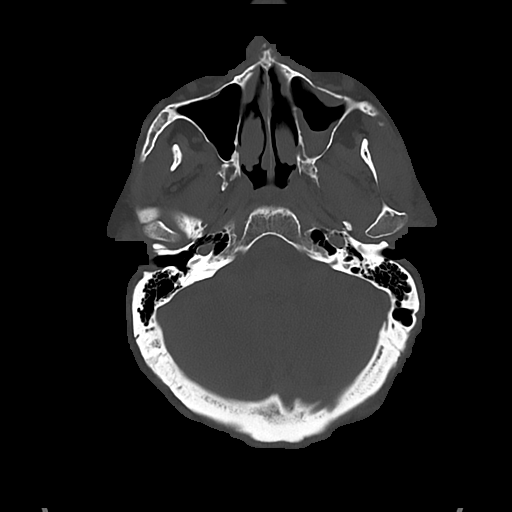
[im 27/89  bone]
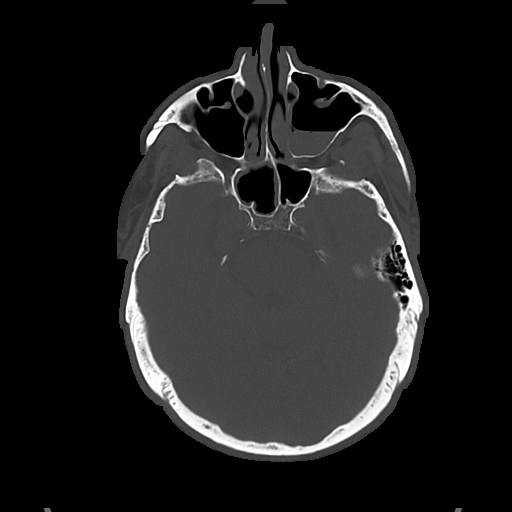

[Series 5: cor soft · coronal · 0.37mm/px · 3 of 73 slices shown]
[im 25/73  brain]
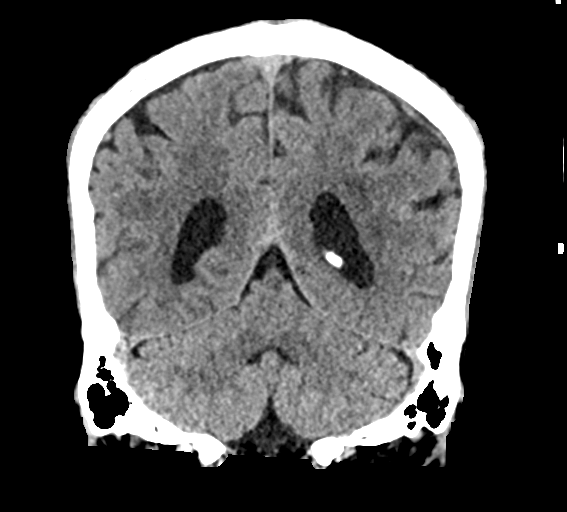
[im 33/73  brain]
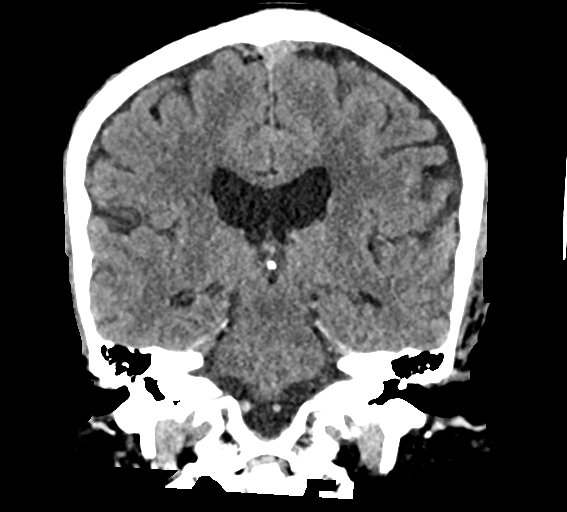
[im 41/73  brain]
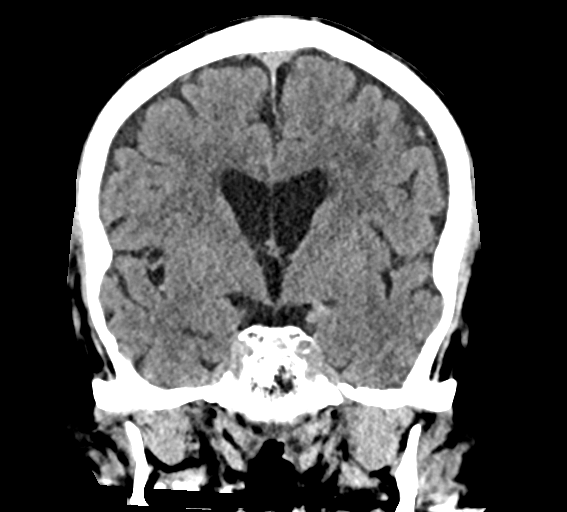

[Series 6: sag soft · sagittal · 0.37mm/px · 3 of 59 slices shown]
[im 20/59  brain]
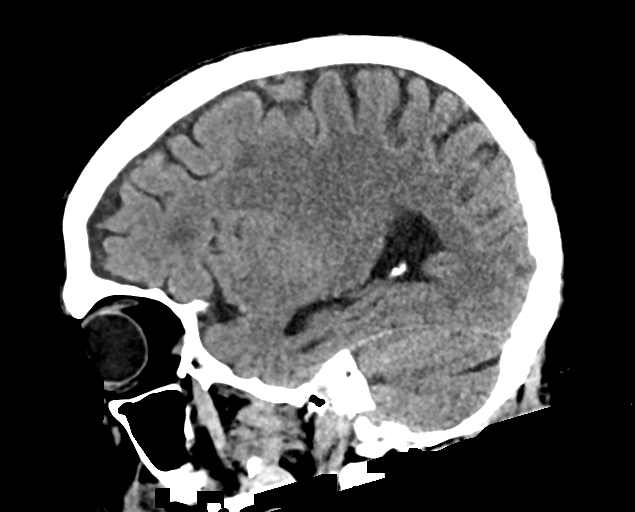
[im 30/59  brain]
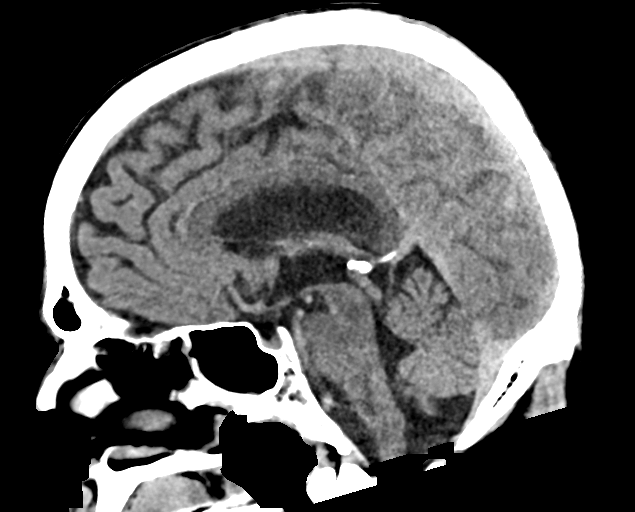
[im 39/59  brain]
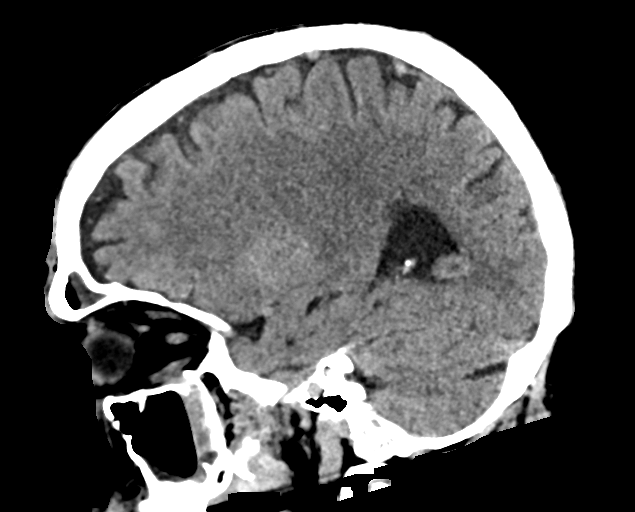

[16 of 47 positions shown; findings below may reference images not displayed]

FINDINGS: Brain: No acute intracranial hemorrhage, midline shift or mass
effect. No extra-axial fluid collection. Diffuse atrophy is noted.
Mild periventricular white matter hypodensities are present
bilaterally. No hydrocephalus.

Vascular: No hyperdense vessel or unexpected calcification.

Skull: There is redemonstration of a fracture through the frontal
bone on the left and left frontal sinus.

Sinuses/Orbits: There is a fracture through the frontal sinus on the
left. Air-fluid levels are noted in the left frontal sinus. There is
partial opacification of the ethmoid air cells bilaterally and left
maxillary sinus. Mucosal thickening is noted in the sphenoid sinuses
and right maxillary sinus. Small amount of retrobulbar fat stranding
is noted on the left, slightly increased from the prior exam. There
is mild proptosis on the left.

Other: Small scalp hematoma containing air anterior to the frontal
bone on the left. Soft tissue swelling is present over the left
orbit. Fracture of the anterior and lateral walls of the maxillary
sinus on the left, unchanged.
IMPRESSION: 1. No acute intracranial hemorrhage.
2. Atrophy with chronic microvascular ischemic changes.
3. Left frontal bone fracture traversing the left frontal sinus and
maxillary sinus on the left are unchanged.
4. Slightly increased retrobulbar fat stranding on the left as
compared with the previous exam with mild proptosis.
